# Patient Record
Sex: Female | Born: 1990 | Race: White | Hispanic: Yes | Marital: Married | Smoking: Never smoker
Health system: Southern US, Community
[De-identification: ages and names within clinical notes are randomized; demographics above are authoritative.]

## PROBLEM LIST (undated history)

## (undated) DIAGNOSIS — O24419 Gestational diabetes mellitus in pregnancy, unspecified control: Secondary | ICD-10-CM

## (undated) DIAGNOSIS — I10 Essential (primary) hypertension: Secondary | ICD-10-CM

## (undated) DIAGNOSIS — Z789 Other specified health status: Secondary | ICD-10-CM

## (undated) DIAGNOSIS — E119 Type 2 diabetes mellitus without complications: Secondary | ICD-10-CM

## (undated) HISTORY — DX: Type 2 diabetes mellitus without complications: E11.9

## (undated) HISTORY — DX: Essential (primary) hypertension: I10

## (undated) HISTORY — DX: Other specified health status: Z78.9

## (undated) HISTORY — PX: NO PAST SURGERIES: SHX2092

## (undated) HISTORY — DX: Gestational diabetes mellitus in pregnancy, unspecified control: O24.419

---

## 2020-03-28 ENCOUNTER — Ambulatory Visit (INDEPENDENT_AMBULATORY_CARE_PROVIDER_SITE_OTHER): Payer: 59 | Admitting: *Deleted

## 2020-03-28 ENCOUNTER — Other Ambulatory Visit: Payer: Self-pay

## 2020-03-28 VITALS — BP 132/75 | HR 80 | Temp 98.0°F | Ht 62.0 in | Wt 181.4 lb

## 2020-03-28 DIAGNOSIS — Z3201 Encounter for pregnancy test, result positive: Secondary | ICD-10-CM | POA: Diagnosis not present

## 2020-03-28 DIAGNOSIS — Z3480 Encounter for supervision of other normal pregnancy, unspecified trimester: Secondary | ICD-10-CM

## 2020-03-28 DIAGNOSIS — Z3A10 10 weeks gestation of pregnancy: Secondary | ICD-10-CM

## 2020-03-28 DIAGNOSIS — Z3481 Encounter for supervision of other normal pregnancy, first trimester: Secondary | ICD-10-CM

## 2020-03-28 LAB — POCT URINE PREGNANCY: Preg Test, Ur: POSITIVE — AB

## 2020-03-28 NOTE — Patient Instructions (Addendum)
https://www.cdc.gov/pregnancy/infections.html">  First Trimester of Pregnancy  The first trimester of pregnancy starts on the first day of your last menstrual period until the end of week 12. This is also called months 1 through 3 of pregnancy. Body changes during your first trimester Your body goes through many changes during pregnancy. The changes usually return to normal after your baby is born. Physical changes  You may gain or lose weight.  Your breasts may grow larger and hurt. The area around your nipples may get darker.  Dark spots or blotches may develop on your face.  You may have changes in your hair. Health changes  You may feel like you might vomit (nauseous), and you may vomit.  You may have heartburn.  You may have headaches.  You may have trouble pooping (constipation).  Your gums may bleed. Other changes  You may get tired easily.  You may pee (urinate) more often.  Your menstrual periods will stop.  You may not feel hungry.  You may want to eat certain kinds of food.  You may have changes in your emotions from day to day.  You may have more dreams. Follow these instructions at home: Medicines  Take over-the-counter and prescription medicines only as told by your doctor. Some medicines are not safe during pregnancy.  Take a prenatal vitamin that contains at least 600 micrograms (mcg) of folic acid. Eating and drinking  Eat healthy meals that include: ? Fresh fruits and vegetables. ? Whole grains. ? Good sources of protein, such as meat, eggs, or tofu. ? Low-fat dairy products.  Avoid raw meat and unpasteurized juice, milk, and cheese.  If you feel like you may vomit, or you vomit: ? Eat 4 or 5 small meals a day instead of 3 large meals. ? Try eating a few soda crackers. ? Drink liquids between meals instead of during meals.  You may need to take these actions to prevent or treat trouble pooping: ? Drink enough fluids to keep your pee  (urine) pale yellow. ? Eat foods that are high in fiber. These include beans, whole grains, and fresh fruits and vegetables. ? Limit foods that are high in fat and sugar. These include fried or sweet foods. Activity  Exercise only as told by your doctor. Most people can do their usual exercise routine during pregnancy.  Stop exercising if you have cramps or pain in your lower belly (abdomen) or low back.  Do not exercise if it is too hot or too humid, or if you are in a place of great height (high altitude).  Avoid heavy lifting.  If you choose to, you may have sex unless your doctor tells you not to. Relieving pain and discomfort  Wear a good support bra if your breasts are sore.  Rest with your legs raised (elevated) if you have leg cramps or low back pain.  If you have bulging veins (varicose veins) in your legs: ? Wear support hose as told by your doctor. ? Raise your feet for 15 minutes, 3-4 times a day. ? Limit salt in your food. Safety  Wear your seat belt at all times when you are in a car.  Talk with your doctor if someone is hurting you or yelling at you.  Talk with your doctor if you are feeling sad or have thoughts of hurting yourself. Lifestyle  Do not use hot tubs, steam rooms, or saunas.  Do not douche. Do not use tampons or scented sanitary pads.  Do not   Do not use herbal medicines, illegal drugs, or medicines that are not approved by your doctor. Do not drink alcohol.  Do not smoke or use any products that contain nicotine or tobacco. If you need help quitting, ask your doctor.  Avoid cat litter boxes and soil that is used by cats. These carry germs that can cause harm to the baby and can cause a loss of your baby by miscarriage or stillbirth. General instructions  Keep all follow-up visits. This is important.  Ask for help if you need counseling or if you need help with nutrition. Your doctor can give you advice or tell you where to go for help.  Visit  your dentist. At home, brush your teeth with a soft toothbrush. Floss gently.  Write down your questions. Take them to your prenatal visits. Where to find more information  American Pregnancy Association: americanpregnancy.org  [employer]: www.acog.org  Office on Women's Health: [employer] Contact a doctor if:  You are dizzy.  You have a fever.  You have mild cramps or pressure in your lower belly.  You have a nagging pain in your belly area.  You continue to feel like you may vomit, you vomit, or you have watery poop (diarrhea) for 24 hours or longer.  You have a bad-smelling fluid coming from your vagina.  You have pain when you pee.  You are exposed to a disease that spreads from person to person, such as chickenpox, measles, Zika virus, HIV, or hepatitis. Get help right away if:  You have spotting or bleeding from your vagina.  You have very bad belly cramping or pain.  You have shortness of breath or chest pain.  You have any kind of injury, such as from a fall or a car crash.  You have new or increased pain, swelling, or redness in an arm or leg. Summary  The first trimester of pregnancy starts on the first day of your last menstrual period until the end of week 12 (months 1 through 3).  Eat 4 or 5 small meals a day instead of 3 large meals.  Do not smoke or use any products that contain nicotine or tobacco. If you need help quitting, ask your doctor.  Keep all follow-up visits. This information is not intended to replace advice given to you by your health care provider. Make sure you discuss any questions you have with your health care provider. Document Revised: 06/03/2019 Document Reviewed: 04/09/2019 Elsevier Patient Education  2021 Elsevier Inc.  Warning Signs During Pregnancy During pregnancy, your body goes through many changes. Some changes may be uncomfortable, but most do not represent a  serious problem. However, it is important to learn when certain signs and symptoms may indicate a problem. Talk with your health care provider about your current health and any medical conditions you have. Make sure you know the symptoms to watch for and report. How does this affect me? Warning signs during pregnancy Let your health care provider know if you have any of the following warning signs:  Dizziness or feeling faint.  Nausea, vomiting, or diarrhea that lasts 24 hours or longer.  Spotting or bleeding from your vagina.  Abdominal cramping or pain in your pelvis or lower back.  Shortness of breath, difficulty breathing, or chest pain.  New or increased pain, swelling, or redness in an arm or leg.  Your baby is moving less than usual or is not moving. You should also watch for signs of  a serious medical condition called preeclampsia. This may include:  A severe, throbbing headache that does not go away.  Vision changes, such as blurred or double vision, light sensitivity, or seeing spots in front of your eyes.  Sudden or extreme swelling of your face, hands, legs, or feet. Pregnancy causes changes that may make it more likely for you to get an infection. Let your health care provider know if you have signs of infection, such as:  A fever.  A bad-smelling vaginal discharge.  Pain or burning when you urinate. How does this affect my baby? Throughout your pregnancy, always report any of the warning signs of a problem to your health care provider. This can help prevent complications that may affect your baby, including:  Increased risk for premature birth.  Infection that may be transmitted to your baby.  Increased risk for stillbirth. Follow these instructions at home:  Take over-the-counter and prescription medicines only as told by your health care provider.  Keep all follow-up visits. This is important.   Where to find more information  Office on Women's Health:  [employer]  [employer]: [employer] Contact a health care provider if:  You have any warning signs of problems during your pregnancy.  Any of the following apply to you during your pregnancy: ? You have strong emotions, such as sadness or anxiety, that interfere with work or personal relationships. ? You feel unsafe in your home. ? You are using tobacco products, alcohol, or drugs, and you need help to stop. Get help right away if:  You have signs or symptoms of labor before 37 weeks of pregnancy. These include: ? Contractions that are 5 minutes or less apart, or that increase in frequency, intensity, or length. ? Sudden, sharp abdominal pain or low back pain. ? Uncontrolled gush or trickle of fluid from your vagina. Summary  Always report any warning signs to your health care provider to prevent complications that may affect both you and your baby.  Talk with your health care provider about your current health and any medical conditions you have. Make sure you know the symptoms to watch for and report.  Keep all follow-up visits. This is important. This information is not intended to replace advice given to you by your health care provider. Make sure you discuss any questions you have with your health care provider. Document Revised: 06/03/2019 Document Reviewed: 05/01/2019 Elsevier Patient Education  2021 Elsevier Inc.  Genetic Testing During Pregnancy Why is genetic testing done? Genetic testing during pregnancy is also called prenatal genetic testing. This type of testing can determine if your baby is at risk of being born with a disorder caused by abnormal genes or chromosomes (genetic disorder). Chromosomes contain genes that control how your baby will develop in your womb. There are many different genetic disorders. Examples of genetic disorders that may be found through genetic testing include Down  syndrome and cystic fibrosis. Gene changes (mutations) can be passed down through families. Genetic testing is offered to women during pregnancy. You can choose whether to have genetic testing. Having genetic testing allows you to:  Discuss your test results and options with your health care provider.  Prepare for a baby that may be born with a genetic disorder. Learning about the disorder ahead of time helps you be better prepared to manage it. Your health care providers can also be prepared in case your baby requires special care before or after birth.  Consider whether you want  to continue with the pregnancy. In some cases, genetic testing may be done to learn about the traits a child will inherit. Types of genetic tests There are two basic types of genetic testing. Screening tests indicate whether your developing baby (fetus) is at higher risk for a genetic disorder. Diagnostic tests check actual fetal cells to diagnose a genetic disorder. Screening tests Screening tests will not harm your baby. They are recommended for all pregnant women. Types of screening tests include:  Carrier screening. This test involves checking genes from both parents by testing their blood or saliva. The test checks to find out if the parents carry a genetic mutation that may be passed to a baby. In most cases, both parents must carry the mutation for a baby to be at risk.  First trimester screening. This test combines a blood test with sound wave imaging of your baby (fetal ultrasound). This screening test checks for a risk of Down syndrome or other defects caused by having extra chromosomes. The ultrasound also checks for defects of the heart, abdomen, or skeleton.  Second trimester screening also combines a blood test with a fetal ultrasound exam. This test checks for a risk of Down syndrome or other defects caused by having extra chromosomes. The ultrasound allows your health care provider to look for genetic  defects of the face, brain, spine, heart, or limbs. Some women may choose to only have an ultrasound exam without a blood test.  Combined or sequential screening. This type of testing combines the results of first and second trimester screening. This type of testing may be more accurate than first or second trimester screening alone.  Cell-free DNA testing. This is a blood test that detects cells released by the placenta that get into the mother's blood. It can be used to check for a risk of Down syndrome, other extra chromosome syndromes, and disorders caused by abnormal numbers of sex chromosomes. This test can be done any time after 10 weeks of pregnancy.      Diagnostic tests Diagnostic tests carry slight risks of problems, including bleeding, infection, and loss of the pregnancy. These tests are done only if your baby is at risk for a genetic disorder. Your health care provider will discuss the risks and benefits of having diagnostic tests before performing these types of tests. Examples of diagnostic tests include:  Chorionic villus sampling (CVS). This involves a procedure to remove and test a sample of cells taken from the placenta. The procedure may be done between 10 and 12 weeks of pregnancy.  Amniocentesis. This involves a procedure to remove and test a sample of fluid (amniotic fluid) and cells from the sac that surrounds the developing baby. The procedure may be done any time during the pregnancy, but it is usually done between 15 and 20 weeks of pregnancy. What do the results mean? For a screening test:  If the results are negative, it often means that your child is not at higher risk. There is still a slight chance your child could have a genetic disorder.  If the results are positive, it does not mean your child will have a genetic disorder. It may mean that your child has a higher-than-normal risk for a genetic disorder. In that case, you should talk with your health care provider  about whether you should have diagnostic genetic tests. For a diagnostic test:  If the result is negative, it is unlikely that your child will have a genetic disorder.  If the test  is positive for a genetic disorder, it is likely that your child will have the disorder. The test may not tell how severe the disorder will be. Talk with your health care provider about your options. Talk with your health care provider about what your results mean. Questions to ask your health care provider Before talking to your health care provider about genetic testing, find out if there is a history of genetic disorders in your family. It may also help to know your family's ethnic origins. Then ask your health care provider the following questions:  Is my baby at risk for a genetic disorder?  What are the benefits of having genetic screening?  What tests are best for me and my baby?  What are the risks of each test?  If I get a positive result on a screening test, what is the next step?  Should I meet with a genetic counselor?  Should my partner or other members of my family be tested?  How much do the tests cost? Will my insurance cover the testing? Summary  Genetic testing is done during pregnancy to find out whether your child is at risk for a genetic disorder.  Genetic testing is offered to women during pregnancy. You can choose whether to have genetic testing.  There are two basic types of genetic testing. Screening tests indicate whether your developing baby (fetus) is at higher risk for a genetic disorder. Diagnostic tests check actual fetal cells to diagnose a genetic disorder.  If a diagnostic genetic test is positive, talk with your health care provider about your options. This information is not intended to replace advice given to you by your health care provider. Make sure you discuss any questions you have with your health care provider. Document Revised: 07/17/2019 Document Reviewed:  07/17/2019 Elsevier Patient Education  2021 Elsevier Inc.  Safe Medications in Pregnancy   Acne: Benzoyl Peroxide Salicylic Acid  Backache/Headache: Tylenol: 2 regular strength every 4 hours OR              2 Extra strength every 6 hours  Colds/Coughs/Allergies: Benadryl (alcohol free) 25 mg every 6 hours as needed Breath right strips Claritin Cepacol throat lozenges Chloraseptic throat spray Cold-Eeze- up to three times per day Cough drops, alcohol free Flonase (by prescription only) Guaifenesin Mucinex Robitussin DM (plain only, alcohol free) Saline nasal spray/drops Sudafed (pseudoephedrine) & Actifed ** use only after [redacted] weeks gestation and if you do not have high blood pressure Tylenol Vicks Vaporub Zinc lozenges Zyrtec   Constipation: Colace Ducolax suppositories Fleet enema Glycerin suppositories Metamucil Milk of magnesia Miralax Senokot Smooth move tea  Diarrhea: Kaopectate Imodium A-D  *NO pepto Bismol  Hemorrhoids: Anusol Anusol HC Preparation H Tucks  Indigestion: Tums Maalox Mylanta Zantac  Pepcid  Insomnia: Benadryl (alcohol free) 25mg  every 6 hours as needed Tylenol PM Unisom, no Gelcaps  Leg Cramps: Tums MagGel  Nausea/Vomiting:  Bonine Dramamine Emetrol Ginger extract Sea bands Meclizine  Nausea medication to take during pregnancy:  Unisom (doxylamine succinate 25 mg tablets) Take one tablet daily at bedtime. If symptoms are not adequately controlled, the dose can be increased to a maximum recommended dose of two tablets daily (1/2 tablet in the morning, 1/2 tablet mid-afternoon and one at bedtime). Vitamin B6 100mg  tablets. Take one tablet twice a day (up to 200 mg per day).  Skin Rashes: Aveeno products Benadryl cream or 25mg  every 6 hours as needed Calamine Lotion 1% cortisone cream  Yeast infection: Gyne-lotrimin 7 Monistat  7   **If taking multiple medications, please check labels to avoid duplicating  the same active ingredients **take medication as directed on the label ** Do not exceed 4000 mg of tylenol in 24 hours **Do not take medications that contain aspirin or ibuprofen

## 2020-03-28 NOTE — Progress Notes (Signed)
   Location: [facility] Patient: clinic Provider: clinic  PRENATAL INTAKE SUMMARY  Ms. Jensen presents today New OB Nurse Interview.  OB History    Gravida  3   Para      Term      Preterm      AB  2   Living        SAB  2   IAB      Ectopic      Multiple      Live Births             I have reviewed the patient's medical, obstetrical, social, and family histories, medications, and available lab results.  SUBJECTIVE She has no unusual complaints  OBJECTIVE Initial Nurse interview for history/labs (New OB)  EDD: 10/23/20 by LMP GA: [redacted]w[redacted]d G3P0020 FHT: not assessed  GENERAL APPEARANCE: alert, well appearing, in no apparent distress, oriented to person, place and time   ASSESSMENT Positive urine pregnancy test Normal pregnancy  PLAN Prenatal care:  [facility] Labs to be completed at next visit with Raelyn Mora, CNM 04/07/20  Follow Up Instructions:   I discussed the assessment and treatment plan with the patient. The patient was provided an opportunity to ask questions and all were answered. The patient agreed with the plan and demonstrated an understanding of the instructions.   The patient was advised to call back or seek an in-person evaluation if the symptoms worsen or if the condition fails to improve as anticipated.  I provided 30 minutes of  face-to-face time during this encounter.  Clovis Pu, RN

## 2020-04-07 ENCOUNTER — Encounter: Payer: Self-pay | Admitting: Obstetrics and Gynecology

## 2020-04-07 ENCOUNTER — Ambulatory Visit (INDEPENDENT_AMBULATORY_CARE_PROVIDER_SITE_OTHER): Payer: 59 | Admitting: Obstetrics and Gynecology

## 2020-04-07 ENCOUNTER — Other Ambulatory Visit (HOSPITAL_COMMUNITY)
Admission: RE | Admit: 2020-04-07 | Discharge: 2020-04-07 | Disposition: A | Discharge status: Home / Self Care | Payer: 59 | Source: Ambulatory Visit | Attending: Obstetrics and Gynecology | Admitting: Obstetrics and Gynecology

## 2020-04-07 ENCOUNTER — Other Ambulatory Visit: Payer: Self-pay

## 2020-04-07 VITALS — BP 151/91 | HR 120 | Temp 99.0°F | Wt 179.2 lb

## 2020-04-07 DIAGNOSIS — O09299 Supervision of pregnancy with other poor reproductive or obstetric history, unspecified trimester: Secondary | ICD-10-CM

## 2020-04-07 DIAGNOSIS — Z3480 Encounter for supervision of other normal pregnancy, unspecified trimester: Secondary | ICD-10-CM | POA: Insufficient documentation

## 2020-04-07 DIAGNOSIS — Z3A11 11 weeks gestation of pregnancy: Secondary | ICD-10-CM

## 2020-04-07 DIAGNOSIS — O169 Unspecified maternal hypertension, unspecified trimester: Secondary | ICD-10-CM | POA: Insufficient documentation

## 2020-04-07 DIAGNOSIS — O99210 Obesity complicating pregnancy, unspecified trimester: Secondary | ICD-10-CM

## 2020-04-07 DIAGNOSIS — O0990 Supervision of high risk pregnancy, unspecified, unspecified trimester: Secondary | ICD-10-CM | POA: Insufficient documentation

## 2020-04-07 HISTORY — DX: Supervision of pregnancy with other poor reproductive or obstetric history, unspecified trimester: O09.299

## 2020-04-07 HISTORY — DX: Obesity complicating pregnancy, unspecified trimester: O99.210

## 2020-04-07 MED ORDER — ASPIRIN 81 MG PO CHEW: 81.0000 mg | CHEWABLE_TABLET | Freq: Every day | ORAL | 6 refills | Status: DC

## 2020-04-07 NOTE — Progress Notes (Signed)
INITIAL OBSTETRICAL VISIT Patient name: Lisa Ritter MRN [id number]  Date of birth: 1990/10/09 Chief Complaint:   Initial Prenatal Visit  History of Present Illness:   Lisa Ritter is a 30 y.o. G62P0020 Caucasian female at 65w4dby LMP with an Estimated Date of Delivery: 10/23/20 being seen today for her initial obstetrical visit.  Her obstetrical history is significant for obesity and history of miscarriage x 2. SAB @ 6 wks with no complications in 23790and 2018. This is a planned pregnancy. She and the father of the baby (FOB) "SAnnie Main live together. She has a support system that consists of the FOB/family/friends. Today she reports feeling a little anxious because she has lst 2 babies in the past. She is worried that this pregnancy has gone the farthest of all of her pregnancies.   Patient's last menstrual period was 01/17/2020 (exact date). Last pap 11/2019. Results were: normal per pt Review of Systems:   Pertinent items are noted in HPI Denies cramping/contractions, leakage of fluid, vaginal bleeding, abnormal vaginal discharge w/ itching/odor/irritation, headaches, visual changes, shortness of breath, chest pain, abdominal pain, severe nausea/vomiting, or problems with urination or bowel movements unless otherwise stated above.  Pertinent History Reviewed:  Reviewed past medical,surgical, social, obstetrical and family history.  Reviewed problem list, medications and allergies. OB History  Gravida Para Term Preterm AB Living  3       2    SAB IAB Ectopic Multiple Live Births  2            # Outcome Date GA Lbr Len/2nd Weight Sex Delivery Anes PTL Lv  3 Current           2 SAB 2018          1 SAB 2015           Physical Assessment:   Vitals:   04/07/20 1345  BP: (!) 151/91  Pulse: (!) 120  Temp: 99 F (37.2 C)  Weight: 179 lb 3.2 oz (81.3 kg)  Body mass index is 32.78 kg/m.       Physical Examination:  General appearance - well appearing, and in no distress  Mental  status - alert, oriented to person, place, and time  Psych:  She has a normal mood and affect  Skin - warm and dry, normal color, no suspicious lesions noted  Chest - effort normal, all lung fields clear to auscultation bilaterally  Heart - normal rate and regular rhythm  Abdomen - soft, nontender  Extremities:  No swelling or varicosities noted  Pelvic - VULVA: normal appearing vulva with no masses, tenderness or lesions  VAGINA: normal appearing vagina with normal color and discharge, no lesions.    CERVIX: normal appearing cervix without discharge or lesions, no CMT  Thin prep pap is not done  Patient informed that the ultrasound is considered a limited OB ultrasound and is not intended to be a complete ultrasound exam.  Patient also informed that the ultrasound is not being completed with the intent of assessing for fetal or placental anomalies or any pelvic abnormalities.  Explained that the purpose of today's ultrasound is to assess for viability.  FHTs visualized by U/S: 155 bpm. Patient acknowledges the purpose of the exam and the limitations of the study.  Assessment & Plan:  1) Low-Risk Pregnancy G3P0020 at 115w4dith an Estimated Date of Delivery: 10/23/20   2) Initial OB visit - Welcomed to practice and introduced self to patient in addition to discussing other advanced  practice providers that she may be seeing at this practice - Congratulated patient - Anticipatory guidance on upcoming appointments - Educated on [place] and pregnancy and the integration of virtual appointments  - Educated on babyscripts app- patient reports she has not received email, encouraged to look in spam folder and to call office if she still has not received email - patient verbalizes understanding    3) Supervision of other normal pregnancy, antepartum - Cervicovaginal ancillary only( [place]) - CBC/D/Plt+RPR+Rh+ABO+Rub Ab... - Culture, OB Urine - Genetic Screening - Enroll Patient in PreNatal  Babyscripts - Protein / creatinine ratio, urine - Comp Met (CMET)  4) Elevated blood pressure affecting pregnancy, antepartum - Protein / creatinine ratio, urine - Comp Met (CMET)  5) [redacted] weeks gestation of pregnancy - Protein / creatinine ratio, urine - Comp Met (CMET)     Meds:  Meds ordered this encounter  Medications  . aspirin 81 MG chewable tablet    Sig: Chew 1 tablet (81 mg total) by mouth daily.    Dispense:  30 tablet    Refill:  6    Order Specific Question:   Supervising Provider    Answer:   Donnamae Jude [[id number]]    Initial labs obtained Continue prenatal vitamins Reviewed n/v relief measures and warning s/s to report Reviewed recommended weight gain based on pre-gravid BMI Encouraged well-balanced diet Genetic Screening discussed: ordered Cystic fibrosis, SMA, Fragile X screening discussed ordered The nature of [place] with multiple MDs and other Advanced Practice Providers was explained to patient; also emphasized that residents, students are part of our team.  Discussed optimized OB schedule and video visits. Advised can have an in-office visit whenever she feels she needs to be seen.  Does not have own BP cuff. BP cuff Rx faxed today. Explained to patient that BP will be mailed to her house. Check BP weekly, let us know if >140/90. Advised to call during normal business hours and there is an after-hours nurse line available.    Follow-up: Return in about 2 months (around 06/07/2020) for Return OB visit.   Orders Placed This Encounter  Procedures  . Culture, OB Urine  . [place] MFM OB DETAIL +14 WK  . CBC/D/Plt+RPR+Rh+ABO+Rub Ab...  . Genetic Screening  . Protein / creatinine ratio, urine  . Comp Met (CMET)  . HgB A1c    Laury Deep MSN, CNM 04/07/2020

## 2020-04-08 LAB — COMPREHENSIVE METABOLIC PANEL
ALT: 44 IU/L — ABNORMAL HIGH (ref 0–32)
AST: 25 IU/L (ref 0–40)
Albumin/Globulin Ratio: 1.7 (ref 1.2–2.2)
Albumin: 4.2 g/dL (ref 3.9–5.0)
Alkaline Phosphatase: 114 IU/L (ref 44–121)
BUN/Creatinine Ratio: 13 (ref 9–23)
BUN: 6 mg/dL (ref 6–20)
Bilirubin Total: 0.2 mg/dL (ref 0.0–1.2)
CO2: 17 mmol/L — ABNORMAL LOW (ref 20–29)
Calcium: 9.1 mg/dL (ref 8.7–10.2)
Chloride: 102 mmol/L (ref 96–106)
Creatinine, Ser: 0.46 mg/dL — ABNORMAL LOW (ref 0.57–1.00)
Globulin, Total: 2.5 g/dL (ref 1.5–4.5)
Glucose: 163 mg/dL — ABNORMAL HIGH (ref 65–99)
Potassium: 3.8 mmol/L (ref 3.5–5.2)
Sodium: 136 mmol/L (ref 134–144)
Total Protein: 6.7 g/dL (ref 6.0–8.5)
eGFR: 132 mL/min/{1.73_m2} (ref >59)

## 2020-04-08 LAB — CBC/D/PLT+RPR+RH+ABO+RUB AB...
Antibody Screen: NEGATIVE
Basophils Absolute: 0 10*3/uL (ref 0.0–0.2)
Basos: 0 %
EOS (ABSOLUTE): 0.1 10*3/uL (ref 0.0–0.4)
Eos: 1 %
HCV Ab: <0.1 s/co ratio (ref 0.0–0.9)
HIV Screen 4th Generation wRfx: NONREACTIVE
Hematocrit: 34.5 % (ref 34.0–46.6)
Hemoglobin: 12.1 g/dL (ref 11.1–15.9)
Hepatitis B Surface Ag: NEGATIVE
Immature Grans (Abs): 0 10*3/uL (ref 0.0–0.1)
Immature Granulocytes: 0 %
Lymphocytes Absolute: 2.7 10*3/uL (ref 0.7–3.1)
Lymphs: 23 %
MCH: 30.3 pg (ref 26.6–33.0)
MCHC: 35.1 g/dL (ref 31.5–35.7)
MCV: 86 fL (ref 79–97)
Monocytes Absolute: 0.5 10*3/uL (ref 0.1–0.9)
Monocytes: 4 %
Neutrophils Absolute: 8.2 10*3/uL — ABNORMAL HIGH (ref 1.4–7.0)
Neutrophils: 72 %
Platelets: 419 10*3/uL (ref 150–450)
RBC: 4 x10E6/uL (ref 3.77–5.28)
RDW: 12.7 % (ref 11.7–15.4)
RPR Ser Ql: NONREACTIVE
Rh Factor: POSITIVE
Rubella Antibodies, IGG: 2.41 index (ref >0.99)
WBC: 11.5 10*3/uL — ABNORMAL HIGH (ref 3.4–10.8)

## 2020-04-08 LAB — PROTEIN / CREATININE RATIO, URINE
Creatinine, Urine: 25.6 mg/dL
Protein, Ur: <4 mg/dL

## 2020-04-08 LAB — CERVICOVAGINAL ANCILLARY ONLY
Bacterial Vaginitis (gardnerella): POSITIVE — AB
Candida Glabrata: NEGATIVE
Candida Vaginitis: NEGATIVE
Chlamydia: NEGATIVE
Comment: NEGATIVE
Comment: NEGATIVE
Comment: NEGATIVE
Comment: NEGATIVE
Comment: NEGATIVE
Comment: NORMAL
Neisseria Gonorrhea: NEGATIVE
Trichomonas: NEGATIVE

## 2020-04-08 LAB — HEMOGLOBIN A1C
Est. average glucose Bld gHb Est-mCnc: 103 mg/dL
Hgb A1c MFr Bld: 5.2 % (ref 4.8–5.6)

## 2020-04-08 LAB — HCV INTERPRETATION

## 2020-04-09 LAB — URINE CULTURE, OB REFLEX: Organism ID, Bacteria: NO GROWTH

## 2020-04-09 LAB — CULTURE, OB URINE

## 2020-04-11 ENCOUNTER — Other Ambulatory Visit: Payer: Self-pay | Admitting: Obstetrics and Gynecology

## 2020-04-11 DIAGNOSIS — N760 Acute vaginitis: Secondary | ICD-10-CM

## 2020-04-11 MED ORDER — METRONIDAZOLE 500 MG PO TABS: 500.0000 mg | ORAL_TABLET | Freq: Two times a day (BID) | ORAL | 0 refills | Status: DC

## 2020-04-12 ENCOUNTER — Telehealth: Payer: Self-pay | Admitting: *Deleted

## 2020-04-12 NOTE — Telephone Encounter (Signed)
Patient called regarding lab result. Patient verified DOB. Advised of positive BV and need for medication. Metronidazole 500 mg 1 tab PO BID x 7 days sent to pharmacy on file. If patient develop yeast infection to call clinic for treatment.  Clovis Pu, RN

## 2020-04-12 NOTE — Telephone Encounter (Signed)
-----   Message from Raelyn Mora, [place] sent at 04/11/2020  4:48 AM EDT ----- Please notify of BV and Rx waiting in pharmacy

## 2020-04-13 ENCOUNTER — Ambulatory Visit (INDEPENDENT_AMBULATORY_CARE_PROVIDER_SITE_OTHER): Payer: 59 | Admitting: Licensed Clinical Social Worker

## 2020-04-13 DIAGNOSIS — F4321 Adjustment disorder with depressed mood: Secondary | ICD-10-CM

## 2020-04-13 NOTE — BH Specialist Note (Signed)
Integrated Behavioral Health via Telemedicine Visit  04/13/2020 Lisa Ritter [id number]  Number of Integrated Behavioral Health visits: 1/6 Session Start time: 10:00am  Session End time: 10:26am Total time: 26 mins via mychart video   Referring Provider: Carloyn Jaeger  Patient/Family location: Home  [facility] Provider location: [facility] All persons participating in visit: Pt M. Dayrit and LCSWA A. Jasiri Hanawalt  Types of Service: Individual psychotherapy and Video visit  I connected with [name] and/or Zhavia Hoglund's n/a via  Telephone or Video Enabled Telemedicine Application  (Video is Caregility application) and verified that I am speaking with the correct person using two identifiers. Discussed confidentiality: Yes   I discussed the limitations of telemedicine and the availability of in person appointments.  Discussed there is a possibility of technology failure and discussed alternative modes of communication if that failure occurs.  I discussed that engaging in this telemedicine visit, they consent to the provision of behavioral healthcare and the services will be billed under their insurance.  Patient and/or legal guardian expressed understanding and consented to Telemedicine visit: Yes   Presenting Concerns: Patient and/or family reports the following symptoms/concerns: trouble staying asleep, decrease appetite and energy Duration of problem: approx 2 months; Severity of problem: mild  Patient and/or Family's Strengths/Protective Factors: Concrete supports in place (healthy food, safe environments, etc.)  Goals Addressed: Patient will: 1.  Reduce symptoms of: adjustment disorder  2.  Increase knowledge and/or ability of: healthy habits  3.  Demonstrate ability to: Increase healthy adjustment to current life circumstances  Progress towards Goals: Ongoing  Interventions: Interventions utilized:  Supportive Counseling Standardized Assessments completed: PHQ  9   Assessment: Patient currently experiencing adjustment disorder  Patient may benefit from integrated behavioral health   Plan: 1. Follow up with behavioral health clinician on : 3 weeks via mychart  2. Behavioral recommendations: delegate task to prevent burnout, place warm compress over eyes to assist with relaxing sleep and communicate needs  3. Referral(s): Integrated [employer] (In Clinic)  I discussed the assessment and treatment plan with the patient and/or parent/guardian. They were provided an opportunity to ask questions and all were answered. They agreed with the plan and demonstrated an understanding of the instructions.   They were advised to call back or seek an in-person evaluation if the symptoms worsen or if the condition fails to improve as anticipated.  Gwyndolyn Saxon, LCSW

## 2020-05-16 ENCOUNTER — Other Ambulatory Visit: Payer: Self-pay | Admitting: *Deleted

## 2020-05-23 ENCOUNTER — Other Ambulatory Visit: Payer: Self-pay

## 2020-05-23 ENCOUNTER — Ambulatory Visit: Payer: 59 | Attending: Obstetrics and Gynecology

## 2020-05-23 ENCOUNTER — Other Ambulatory Visit: Payer: Self-pay | Admitting: Obstetrics and Gynecology

## 2020-05-23 ENCOUNTER — Encounter: Payer: Self-pay | Admitting: *Deleted

## 2020-05-23 ENCOUNTER — Ambulatory Visit: Payer: 59 | Admitting: *Deleted

## 2020-05-23 DIAGNOSIS — O99210 Obesity complicating pregnancy, unspecified trimester: Secondary | ICD-10-CM | POA: Diagnosis present

## 2020-05-23 DIAGNOSIS — Z3480 Encounter for supervision of other normal pregnancy, unspecified trimester: Secondary | ICD-10-CM

## 2020-05-24 ENCOUNTER — Other Ambulatory Visit: Payer: Self-pay | Admitting: *Deleted

## 2020-05-24 DIAGNOSIS — Z363 Encounter for antenatal screening for malformations: Secondary | ICD-10-CM

## 2020-05-27 ENCOUNTER — Ambulatory Visit: Payer: 59

## 2020-05-27 ENCOUNTER — Other Ambulatory Visit: Payer: 59

## 2020-06-02 ENCOUNTER — Encounter: Payer: Self-pay | Admitting: Obstetrics and Gynecology

## 2020-06-02 ENCOUNTER — Other Ambulatory Visit: Payer: Self-pay

## 2020-06-02 ENCOUNTER — Ambulatory Visit (INDEPENDENT_AMBULATORY_CARE_PROVIDER_SITE_OTHER): Payer: 59 | Admitting: Obstetrics and Gynecology

## 2020-06-02 VITALS — BP 137/81 | HR 98 | Temp 98.0°F | Wt 179.0 lb

## 2020-06-02 DIAGNOSIS — O162 Unspecified maternal hypertension, second trimester: Secondary | ICD-10-CM

## 2020-06-02 DIAGNOSIS — O10919 Unspecified pre-existing hypertension complicating pregnancy, unspecified trimester: Secondary | ICD-10-CM

## 2020-06-02 DIAGNOSIS — Z3480 Encounter for supervision of other normal pregnancy, unspecified trimester: Secondary | ICD-10-CM

## 2020-06-02 DIAGNOSIS — Z3A17 17 weeks gestation of pregnancy: Secondary | ICD-10-CM

## 2020-06-02 NOTE — Progress Notes (Signed)
  [place] PREGNANCY OFFICE VISIT Patient name: Lisa Ritter MRN [id number]  Date of birth: 1990-02-07 Chief Complaint:   Routine Prenatal Visit  History of Present Illness:   Lisa Ritter is a 30 y.o. G35P0020 female at [redacted]w[redacted]d with an Estimated Date of Delivery: 11/09/20 being seen today for ongoing management of a high-risk pregnancy complicated by cHTN (newly diagnosed) Today she reports no complaints. Contractions: Not present. Vag. Bleeding: None.  Movement: Absent. denies leaking of fluid.  Review of Systems:   Pertinent items are noted in HPI Denies abnormal vaginal discharge w/ itching/odor/irritation, headaches, visual changes, shortness of breath, chest pain, abdominal pain, severe nausea/vomiting, or problems with urination or bowel movements unless otherwise stated above. Pertinent History Reviewed:  Reviewed past medical,surgical, social, obstetrical and family history.  Reviewed problem list, medications and allergies. Physical Assessment:   Vitals:   06/02/20 1319  BP: 137/81  Pulse: 98  Temp: 98 F (36.7 C)  Weight: 179 lb (81.2 kg)  Body mass index is 32.74 kg/m.           Physical Examination:   General appearance: alert, well appearing, and in no distress and oriented to person, place, and time  Mental status: alert, oriented to person, place, and time, normal mood, behavior, speech, dress, motor activity, and thought processes  Skin: warm & dry   Extremities: Edema: None    Cardiovascular: normal heart rate noted  Respiratory: normal respiratory effort, no distress  Abdomen: gravid, soft, non-tender  Pelvic: Cervical exam deferred         Fetal Status: Fetal Heart Rate (bpm): 139 Fundal Height: 18 cm Movement: Absent    Fetal Surveillance Testing today: none   No results found for this or any previous visit (from the past 24 hour(s)).  Assessment & Plan:  1) High-risk pregnancy G3P0020 at [redacted]w[redacted]d with an Estimated Date of Delivery: 11/09/20   2) Supervision of  other normal pregnancy, antepartum  - AFP, Serum, Open Spina Bifida  3) [redacted] weeks gestation of pregnancy   4) Elevated blood pressure affecting pregnancy in second trimester, antepartum  - CBC,  - Comp Met (CMET),  - Protein / creatinine ratio, urine  5) Chronic hypertension affecting pregnancy - Patient states her BP last night at home was 117/83  Meds: No orders of the defined types were placed in this encounter.   Labs/procedures today: Repeat PEC labs and AFP Treatment Plan:  Manage as cHTN on no meds in pregnancy  Reviewed: Preterm labor symptoms and general obstetric precautions including but not limited to vaginal bleeding, contractions, leaking of fluid and fetal movement were reviewed in detail with the patient.  All questions were answered. Has home bp cuff. Check bp weekly, let us know if >140/90.   Follow-up: Return in about 7 weeks (around 07/21/2020) for Return OB - My Chart video.  Orders Placed This Encounter  Procedures  . CBC  . Comp Met (CMET)  . Protein / creatinine ratio, urine  . AFP, Serum, Open Spina Bifida   Laury Deep MSN, CNM 06/02/2020 2:05 PM

## 2020-06-03 LAB — COMPREHENSIVE METABOLIC PANEL
ALT: 79 IU/L — ABNORMAL HIGH (ref 0–32)
AST: 35 IU/L (ref 0–40)
Albumin/Globulin Ratio: 1.5 (ref 1.2–2.2)
Albumin: 4 g/dL (ref 3.9–5.0)
Alkaline Phosphatase: 127 IU/L — ABNORMAL HIGH (ref 44–121)
BUN/Creatinine Ratio: 8 — ABNORMAL LOW (ref 9–23)
BUN: 4 mg/dL — ABNORMAL LOW (ref 6–20)
Bilirubin Total: 0.2 mg/dL (ref 0.0–1.2)
CO2: 22 mmol/L (ref 20–29)
Calcium: 9.4 mg/dL (ref 8.7–10.2)
Chloride: 100 mmol/L (ref 96–106)
Creatinine, Ser: 0.53 mg/dL — ABNORMAL LOW (ref 0.57–1.00)
Globulin, Total: 2.7 g/dL (ref 1.5–4.5)
Glucose: 85 mg/dL (ref 65–99)
Potassium: 4.1 mmol/L (ref 3.5–5.2)
Sodium: 134 mmol/L (ref 134–144)
Total Protein: 6.7 g/dL (ref 6.0–8.5)
eGFR: 128 mL/min/{1.73_m2} (ref >59)

## 2020-06-03 LAB — CBC
Hematocrit: 35.3 % (ref 34.0–46.6)
Hemoglobin: 11.8 g/dL (ref 11.1–15.9)
MCH: 30 pg (ref 26.6–33.0)
MCHC: 33.4 g/dL (ref 31.5–35.7)
MCV: 90 fL (ref 79–97)
Platelets: 393 10*3/uL (ref 150–450)
RBC: 3.93 x10E6/uL (ref 3.77–5.28)
RDW: 12.4 % (ref 11.7–15.4)
WBC: 12.6 10*3/uL — ABNORMAL HIGH (ref 3.4–10.8)

## 2020-06-03 LAB — PROTEIN / CREATININE RATIO, URINE
Creatinine, Urine: 33.2 mg/dL
Protein, Ur: 5.6 mg/dL
Protein/Creat Ratio: 169 mg/g creat (ref 0–200)

## 2020-06-04 LAB — AFP, SERUM, OPEN SPINA BIFIDA
AFP MoM: 2.23
AFP Value: 79.5 ng/mL
Gest. Age on Collection Date: 17 weeks
Maternal Age At EDD: 31.3 yr
OSBR Risk 1 IN: 492
Test Results:: NEGATIVE
Weight: 179 [lb_av]

## 2020-06-20 ENCOUNTER — Ambulatory Visit: Payer: 59 | Admitting: *Deleted

## 2020-06-20 ENCOUNTER — Other Ambulatory Visit: Payer: Self-pay | Admitting: *Deleted

## 2020-06-20 ENCOUNTER — Ambulatory Visit: Payer: 59 | Attending: Obstetrics and Gynecology

## 2020-06-20 ENCOUNTER — Other Ambulatory Visit: Payer: Self-pay

## 2020-06-20 ENCOUNTER — Encounter: Payer: Self-pay | Admitting: *Deleted

## 2020-06-20 VITALS — BP 135/70 | HR 87

## 2020-06-20 DIAGNOSIS — Z3480 Encounter for supervision of other normal pregnancy, unspecified trimester: Secondary | ICD-10-CM | POA: Diagnosis present

## 2020-06-20 DIAGNOSIS — Z363 Encounter for antenatal screening for malformations: Secondary | ICD-10-CM | POA: Diagnosis present

## 2020-06-23 ENCOUNTER — Telehealth: Payer: Self-pay

## 2020-06-23 ENCOUNTER — Other Ambulatory Visit: Payer: Self-pay

## 2020-06-23 ENCOUNTER — Telehealth (INDEPENDENT_AMBULATORY_CARE_PROVIDER_SITE_OTHER): Payer: 59 | Admitting: Obstetrics and Gynecology

## 2020-06-23 ENCOUNTER — Encounter: Payer: Self-pay | Admitting: Obstetrics and Gynecology

## 2020-06-23 VITALS — BP 142/89 | HR 80

## 2020-06-23 DIAGNOSIS — Z3A20 20 weeks gestation of pregnancy: Secondary | ICD-10-CM

## 2020-06-23 DIAGNOSIS — O10919 Unspecified pre-existing hypertension complicating pregnancy, unspecified trimester: Secondary | ICD-10-CM

## 2020-06-23 DIAGNOSIS — O0990 Supervision of high risk pregnancy, unspecified, unspecified trimester: Secondary | ICD-10-CM

## 2020-06-23 DIAGNOSIS — O10912 Unspecified pre-existing hypertension complicating pregnancy, second trimester: Secondary | ICD-10-CM

## 2020-06-23 NOTE — Progress Notes (Signed)
   MY CHART VIDEO VIRTUAL OBSTETRICS VISIT ENCOUNTER NOTE  I connected with Lisa Ritter on 06/23/20 at 11:10 AM EDT by My Chart video at home and verified that I am speaking with the correct person using two identifiers. Provider located at [facility] for [employer] at [place].   I discussed the limitations, risks, security and privacy concerns of performing an evaluation and management service by My Chart video and the availability of in person appointments. I also discussed with the patient that there may be a patient responsible charge related to this service. The patient expressed understanding and agreed to proceed.  I discussed the limitations of telemedicine and the availability of in person appointments.  Discussed there is a possibility of technology failure and discussed alternative modes of communication if that failure occurs.  Subjective:  Lisa Ritter is a 30 y.o. G3P0020 at [redacted]w[redacted]d being followed for ongoing prenatal care.  She is currently monitored for the following issues for this high-risk pregnancy and has Supervision of high risk pregnancy, antepartum; Obesity affecting pregnancy, antepartum; History of miscarriage, currently pregnant; Elevated blood pressure affecting pregnancy, antepartum; and Chronic hypertension affecting pregnancy on their problem list.  Patient reports no complaints. Reports fetal movement. Denies any contractions, bleeding or leaking of fluid.   The following portions of the patient's history were reviewed and updated as appropriate: allergies, current medications, past family history, past medical history, past social history, past surgical history and problem list.   Objective:   General:  Alert, oriented and cooperative.   Mental Status: Normal mood and affect perceived. Normal judgment and thought content.  Rest of physical exam deferred due to type of encounter  BP (!) 142/89   Pulse 80   LMP 01/17/2020 (Exact Date)  **Done by patient's  own at home BP cuff and scale  Assessment and Plan:  Pregnancy: G3P0020 at [redacted]w[redacted]d  1. Supervision of high risk pregnancy, antepartum  2. Chronic hypertension affecting pregnancy - Stable BPs despite elevation  3. [redacted] weeks gestation of pregnancy   Preterm labor symptoms and general obstetric precautions including but not limited to vaginal bleeding, contractions, leaking of fluid and fetal movement were reviewed in detail with the patient.  I discussed the assessment and treatment plan with the patient. The patient was provided an opportunity to ask questions and all were answered. The patient agreed with the plan and demonstrated an understanding of the instructions. The patient was advised to call back or seek an in-person office evaluation/go to [facility] at [facility] for any urgent or concerning symptoms. Please refer to After Visit Summary for other counseling recommendations.   I provided 5 minutes of non-face-to-face time during this encounter. There was 5 minutes of chart review time spent prior to this encounter. Total time spent = 10 minutes.  Return in about 4 weeks (around 07/21/2020) for Return OB - My Chart video.  Future Appointments  Date Time Provider Department Center  07/18/2020 11:15 AM [facility] NURSE [facility]  07/18/2020 11:30 AM [facility] US3 [facility]  07/22/2020 11:10 AM Bernerd Limbo, CNM [facility] None    Raelyn Mora, CNM Center for [employer] [facility], [facility]

## 2020-06-23 NOTE — Telephone Encounter (Signed)
Attempted to call pt to begin virtual visit. Pt did not answer. VM left asking pt to return call to office.  °

## 2020-06-23 NOTE — Patient Instructions (Signed)
Hypertension During Pregnancy High blood pressure (hypertension) is when the force of blood pumping through the arteries is high enough to cause problems with your health. Arteries are blood vessels that carry blood from the heart throughout the body. Hypertension during pregnancy can cause problems for you and your baby. It can be mild or severe. There are different types of hypertension that can happen during pregnancy. These include: Chronic hypertension. This happens when you had high blood pressure before you became pregnant, and it continues during the pregnancy. Hypertension that develops before you are [redacted] weeks pregnant and continues during the pregnancy is also called chronic hypertension. If you have chronic hypertension, it will not go away after you have your baby. You will need follow-up visits with your health care provider after you have your baby. Your health care provider may want you to keep taking medicine for your blood pressure. Gestational hypertension. This is hypertension that develops after the 20th week of pregnancy. Gestational hypertension usually goes away after you have your baby, but your health care provider will need to monitor your blood pressure to make sure that it is getting better. Postpartum hypertension. This is high blood pressure that was present before delivery and continues after delivery or that starts after delivery. This usually occurs within 48 hours after childbirth but may occur up to 6 weeks after giving birth. When hypertension during pregnancy is severe, it is a medical emergency that requires treatment right away. How does this affect me? Women who have hypertension during pregnancy have a greater chance of developing hypertension later in life or during future pregnancies. In some cases, hypertension during pregnancy can cause serious complications, such as: Stroke. Heart attack. Injury to other organs, such as kidneys, lungs, or  liver. Preeclampsia. A condition called hemolysis, elevated liver enzymes, and low platelet count (HELLP) syndrome. Convulsions or seizures. Placental abruption. How does this affect my baby? Hypertension during pregnancy can affect your baby. Your baby may: Be born early (prematurely). Not weigh as much as he or she should at birth (low birth weight). Not tolerate labor well, leading to an unplanned cesarean delivery. This condition may also result in a baby's death before birth (stillbirth). What are the risks? There are certain factors that make it more likely for you to develop hypertension during pregnancy. These include: Having hypertension during a previous pregnancy or a family history of hypertension. Being overweight. Being age 35 or older. Being pregnant for the first time. Being pregnant with more than one baby. Becoming pregnant using fertilization methods, such as IVF (in vitro fertilization). Having other medical problems, such as diabetes, kidney disease, or lupus. What can I do to lower my risk? The exact cause of hypertension during pregnancy is not known. You may be able to lower your risk by: Maintaining a healthy weight. Eating a healthy and balanced diet. Following your health care provider's instructions about treating any long-term conditions that you had before becoming pregnant. It is very important to keep all of your prenatal care appointments. Your health care provider will check your blood pressure and make sure that your pregnancy is progressing as expected. If a problem is found, early treatment can prevent complications. How is this treated? Treatment for hypertension during pregnancy varies depending on the type of hypertension you have and how serious it is. If you were taking medicine for high blood pressure before you became pregnant, talk with your health care provider. You may need to change medicine during pregnancy because some   medicines, like ACE  inhibitors, may not be considered safe for your baby. If you have gestational hypertension, your health care provider may order medicine to treat this during pregnancy. If you are at risk for preeclampsia, your health care provider may recommend that you take a low-dose aspirin during your pregnancy. If you have severe hypertension, you may need to be hospitalized so you and your baby can be monitored closely. You may also need to be given medicine to lower your blood pressure. In some cases, if your condition gets worse, you may need to deliver your baby early. Follow these instructions at home: Eating and drinking  Drink enough fluid to keep your urine pale yellow. Avoid caffeine. Lifestyle Do not use any products that contain nicotine or tobacco. These products include cigarettes, chewing tobacco, and vaping devices, such as e-cigarettes. If you need help quitting, ask your health care provider. Do not use alcohol or drugs. Avoid stress as much as possible. Rest and get plenty of sleep. Regular exercise can help to reduce your blood pressure. Ask your health care provider what kinds of exercise are best for you. General instructions Take over-the-counter and prescription medicines only as told by your health care provider. Keep all prenatal and follow-up visits. This is important. Contact a health care provider if: You have symptoms that your health care provider told you may require more treatment or monitoring, such as: Headaches. Nausea or vomiting. Abdominal pain. Dizziness. Light-headedness. Get help right away if: You have symptoms of serious complications, such as: Severe abdominal pain that does not get better with treatment. A severe headache that does not get better, blurred vision, or double vision. Vomiting that does not get better. Sudden, rapid weight gain or swelling in your hands, ankles, or face. Vaginal bleeding. Blood in your urine. Shortness of breath or chest  pain. Weakness on one side of your body or difficulty speaking. Your baby is not moving as much as usual. These symptoms may represent a serious problem that is an emergency. Do not wait to see if the symptoms will go away. Get medical help right away. Call your local emergency services (911 in the [place] Do not drive yourself to the hospital. Summary Hypertension during pregnancy can cause problems for you and your baby. Treatment for hypertension during pregnancy varies depending on the type of hypertension you have and how serious it is. Keep all prenatal and follow-up visits. This is important. Get help right away if you have symptoms of serious complications related to high blood pressure. This information is not intended to replace advice given to you by your health care provider. Make sure you discuss any questions you have with your health care provider. Document Revised: 09/17/2019 Document Reviewed: 09/17/2019 Elsevier Patient Education  2022 [employer]  

## 2020-07-18 ENCOUNTER — Ambulatory Visit: Payer: 59 | Admitting: *Deleted

## 2020-07-18 ENCOUNTER — Ambulatory Visit: Payer: 59 | Attending: Obstetrics

## 2020-07-18 ENCOUNTER — Other Ambulatory Visit: Payer: Self-pay | Admitting: *Deleted

## 2020-07-18 ENCOUNTER — Encounter: Payer: Self-pay | Admitting: *Deleted

## 2020-07-18 ENCOUNTER — Other Ambulatory Visit: Payer: Self-pay

## 2020-07-18 VITALS — BP 144/62 | HR 82

## 2020-07-18 DIAGNOSIS — O0990 Supervision of high risk pregnancy, unspecified, unspecified trimester: Secondary | ICD-10-CM

## 2020-07-18 DIAGNOSIS — Z3A23 23 weeks gestation of pregnancy: Secondary | ICD-10-CM

## 2020-07-18 DIAGNOSIS — O10919 Unspecified pre-existing hypertension complicating pregnancy, unspecified trimester: Secondary | ICD-10-CM

## 2020-07-18 DIAGNOSIS — Z362 Encounter for other antenatal screening follow-up: Secondary | ICD-10-CM | POA: Diagnosis not present

## 2020-07-18 DIAGNOSIS — O10012 Pre-existing essential hypertension complicating pregnancy, second trimester: Secondary | ICD-10-CM | POA: Diagnosis not present

## 2020-07-18 DIAGNOSIS — E669 Obesity, unspecified: Secondary | ICD-10-CM | POA: Diagnosis not present

## 2020-07-18 DIAGNOSIS — O321XX0 Maternal care for breech presentation, not applicable or unspecified: Secondary | ICD-10-CM

## 2020-07-18 DIAGNOSIS — O99212 Obesity complicating pregnancy, second trimester: Secondary | ICD-10-CM

## 2020-07-22 ENCOUNTER — Other Ambulatory Visit: Payer: Self-pay

## 2020-07-22 ENCOUNTER — Telehealth (INDEPENDENT_AMBULATORY_CARE_PROVIDER_SITE_OTHER): Payer: 59 | Admitting: Certified Nurse Midwife

## 2020-07-22 VITALS — BP 133/90 | HR 86 | Wt 184.0 lb

## 2020-07-22 DIAGNOSIS — Z3A24 24 weeks gestation of pregnancy: Secondary | ICD-10-CM

## 2020-07-22 DIAGNOSIS — O0990 Supervision of high risk pregnancy, unspecified, unspecified trimester: Secondary | ICD-10-CM

## 2020-07-22 DIAGNOSIS — O10912 Unspecified pre-existing hypertension complicating pregnancy, second trimester: Secondary | ICD-10-CM

## 2020-07-22 DIAGNOSIS — R4589 Other symptoms and signs involving emotional state: Secondary | ICD-10-CM

## 2020-07-22 DIAGNOSIS — O99891 Other specified diseases and conditions complicating pregnancy: Secondary | ICD-10-CM

## 2020-07-22 DIAGNOSIS — O10919 Unspecified pre-existing hypertension complicating pregnancy, unspecified trimester: Secondary | ICD-10-CM

## 2020-07-22 NOTE — Progress Notes (Signed)
OBSTETRICS PRENATAL VIRTUAL VISIT ENCOUNTER NOTE  Provider location: [facility]   Patient location: Home  I connected with Lisa Ritter on 07/22/20 at 11:10 AM EDT by MyChart Video Encounter and verified that I am speaking with the correct person using two identifiers. I discussed the limitations, risks, security and privacy concerns of performing an evaluation and management service virtually and the availability of in person appointments. I also discussed with the patient that there may be a patient responsible charge related to this service. The patient expressed understanding and agreed to proceed. Subjective:  Lisa Ritter is a 30 y.o. G3P0020 at [redacted]w[redacted]d being seen today for ongoing prenatal care.  She is currently monitored for the following issues for this high-risk pregnancy and has Supervision of high risk pregnancy, antepartum; Obesity affecting pregnancy, antepartum; History of miscarriage, currently pregnant; Elevated blood pressure affecting pregnancy, antepartum; and Chronic hypertension affecting pregnancy on their problem list.  Patient reports no complaints.  Contractions: Not present. Vag. Bleeding: None.  Movement: Present. Denies any leaking of fluid.   The following portions of the patient's history were reviewed and updated as appropriate: allergies, current medications, past family history, past medical history, past social history, past surgical history and problem list.   Objective:   Vitals:   07/22/20 1107  BP: 133/90  Pulse: 86  Weight: 184 lb (83.5 kg)    Fetal Status:     Movement: Present     General:  Alert, oriented and cooperative. Patient is in no acute distress.  Respiratory: Normal respiratory effort, no problems with respiration noted  Mental Status: Normal mood and affect. Normal behavior. Normal judgment and thought content.  Rest of physical exam deferred due to type of encounter  Imaging: [place] MFM OB FOLLOW  UP  Result Date: 07/18/2020 ----------------------------------------------------------------------  OBSTETRICS REPORT                       (Signed Final 07/18/2020 12:46 pm) ---------------------------------------------------------------------- Patient Info  ID #:       [id number]                          D.O.B.:  September 09, 1990 (29 yrs)  Name:       Lisa Ritter                    Visit Date: 07/18/2020 11:46 am ---------------------------------------------------------------------- Performed By  Attending:        Noralee Space MD        Secondary Phy.:   Dia Sitter                                                             DAWSON CNM  Performed By:     Eden Lathe BS      Address:          [address] [place]                    [place]T  [address]                                                             [place], [place]                                                             [address]  Referred By:      [facility]        [facility] ---------------------------------------------------------------------- Orders  #  Description                           Code        Ordered By  1  [place] MFM OB FOLLOW UP                   [phone]    Rosana Hoes ----------------------------------------------------------------------  #  Order #                     Accession #                Episode #  1  [id number]                   [id number]                 [id number] ---------------------------------------------------------------------- Indications  [redacted] weeks gestation of pregnancy                Z3A.23  Obesity complicating pregnancy, second         O99.212  trimester (BMI 32)  Antenatal follow-up for nonvisualized fetal    Z36.2  anatomy  LR NIPS   Hypertension - Chronic/Pre-existing            O10.019 ---------------------------------------------------------------------- Fetal Evaluation  Num Of Fetuses:         1  Fetal Heart Rate(bpm):  148  Cardiac Activity:  Observed  Presentation:           Breech  Placenta:               Anterior  P. Cord Insertion:      Visualized  Amniotic Fluid  AFI FV:      Within normal limits                              Largest Pocket(cm)                              6 ---------------------------------------------------------------------- Biometry  BPD:      57.9  mm     G. Age:  23w 5d         45  %    CI:         70.2   %    70 - 86                                                          FL/HC:      17.7   %    18.7 - 20.9  HC:      220.4  mm     G. Age:  24w 1d         47  %    HC/AC:      1.14        1.05 - 1.21  AC:      193.1  mm     G. Age:  24w 0d         51  %    FL/BPD:     67.4   %    71 - 87  FL:         39  mm     G. Age:  22w 4d          9  %    FL/AC:      20.2   %    20 - 24  HUM:      35.3  mm     G. Age:  22w 1d          7  %  LV:        5.7  mm  Est. FW:     596  gm      1 lb 5 oz     30  % ---------------------------------------------------------------------- OB History  Blood Type:   O+  Gravidity:    3         Term:   0        Prem:   0        SAB:   2  TOP:          0       Ectopic:  0        Living: 0 ---------------------------------------------------------------------- Gestational Age  LMP:           26w 1d        Date:  01/17/20                 EDD:   10/23/20  U/S Today:     23w 4d  EDD:   11/10/20  Best:          23w 5d     Det. By:  U/S  (05/23/20)          EDD:   11/09/20 ---------------------------------------------------------------------- Anatomy  Cranium:               Appears normal         LVOT:                   Appears normal  Cavum:                 Appears normal         Aortic Arch:            Appears normal  Ventricles:            Appears normal          Ductal Arch:            Appears normal  Choroid Plexus:        Previously seen        Diaphragm:              Appears normal  Cerebellum:            Previously seen        Stomach:                Appears normal, left                                                                        sided  Posterior Fossa:       Previously seen        Abdomen:                Appears normal  Nuchal Fold:           Previously seen        Abdominal Wall:         Appears nml (cord                                                                        insert, abd wall)  Face:                  Appears normal         Cord Vessels:           Appears normal (3                         (orbits and profile)                           vessel cord)  Lips:                  Appears normal         Kidneys:  Appear normal  Palate:                Limited                Bladder:                Appears normal  Thoracic:              Appears normal         Spine:                  Appears normal  Heart:                 Appears normal         Upper Extremities:      Previously seen                         (4CH, axis, and                         situs)  RVOT:                  Appears normal         Lower Extremities:      Previously seen ---------------------------------------------------------------------- Targeted Anatomy  Head/Neck  Nasal Bone:            Present                Mandible:               Appears normal  Orbits/Eyes:           Appears normal         Maxilla:                Appears normal  Thorax  SVC:                   Appears normal         IVC:                    Appears normal  3 Vessel View:         Appears normal  Extremities  Lt Hand:               Open hand nml          Lt Foot:                Appears normal  Rt Hand:               Open hand nml          Rt Foot:                Appears normal  Other  Genitalia:             Normal Female ----------------------------------------------------------------------  Cervix Uterus Adnexa  Cervix  Length:           3.86  cm.  Normal appearance by transabdominal scan.  Uterus  No abnormality visualized.  Right Ovary  Within normal limits.  Left Ovary  Within normal limits.  Cul De Sac  No free fluid seen.  Adnexa  No abnormality visualized. ---------------------------------------------------------------------- Impression  Patient returned for completion of fetal anatomy .Amniotic  fluid is normal and good fetal activity is seen .Fetal biometry  is consistent with her previously-established dates .Fetal  anatomical survey was completed  and appears normal.  BP at our office: 144/62 mm Hg . ---------------------------------------------------------------------- Recommendations  -An appointment was made for her to return in 4 weeks for  fetal growth assessment. ----------------------------------------------------------------------                  Noralee Space, MD Electronically Signed Final Report   07/18/2020 12:46 pm ----------------------------------------------------------------------   Assessment and Plan:  Pregnancy: G3P0020 at [redacted]w[redacted]d 1. Supervision of high risk pregnancy, antepartum - Doing well, feeling regular and vigorous fetal movement  2. [redacted] weeks gestation of pregnancy - Routine OB care  3. Chronic hypertension affecting pregnancy - BP has been consistently elevated for the last few readings >140/90 (either systolic or diastolic) - pt states she gets very anxious before appointments due to her two previous losses (6wks). - Explained the connection between anxiety induced hypertension and preeclampsia - Will schedule for repeat labs/BP check on Monday and begin low-dose antihypertensive ALT remains elevated/uptrends and BP >140/90. If down-trending labs, can consider starting on daily vistaril. Pt amenable to either option. Advised Venia Carbon, NP will be following up about her labs.  4. Feeling anxious - Gave reassurance about this pregnancy and  encouraged use of coping skills prior to visits. Can start on daily vistaril if needed.  Preterm labor symptoms and general obstetric precautions including but not limited to vaginal bleeding, contractions, leaking of fluid and fetal movement were reviewed in detail with the patient. I discussed the assessment and treatment plan with the patient. The patient was provided an opportunity to ask questions and all were answered. The patient agreed with the plan and demonstrated an understanding of the instructions. The patient was advised to call back or seek an in-person office evaluation/go to [facility] at [facility] for any urgent or concerning symptoms. Please refer to After Visit Summary for other counseling recommendations.   I provided 10 minutes of face-to-face time during this encounter.  Future Appointments  Date Time Provider Department Center  07/25/2020 10:10 AM [facility] NURSE [facility] None  08/16/2020  2:30 PM [facility] NURSE [facility]  08/16/2020  2:45 PM [facility] US5 [facility]  08/19/2020  8:30 AM Gerrit Heck, CNM [facility] None    Bernerd Limbo, CNM [facility] [employer], [facility]

## 2020-07-25 ENCOUNTER — Other Ambulatory Visit: Payer: Self-pay

## 2020-07-25 ENCOUNTER — Other Ambulatory Visit: Payer: Self-pay | Admitting: Obstetrics and Gynecology

## 2020-07-25 ENCOUNTER — Ambulatory Visit (INDEPENDENT_AMBULATORY_CARE_PROVIDER_SITE_OTHER): Payer: 59 | Admitting: *Deleted

## 2020-07-25 VITALS — BP 129/81 | HR 84 | Temp 98.5°F | Ht 64.0 in | Wt 183.6 lb

## 2020-07-25 DIAGNOSIS — O0990 Supervision of high risk pregnancy, unspecified, unspecified trimester: Secondary | ICD-10-CM

## 2020-07-25 DIAGNOSIS — O169 Unspecified maternal hypertension, unspecified trimester: Secondary | ICD-10-CM

## 2020-07-25 DIAGNOSIS — O10919 Unspecified pre-existing hypertension complicating pregnancy, unspecified trimester: Secondary | ICD-10-CM

## 2020-07-25 MED ORDER — LABETALOL HCL 200 MG PO TABS: 200.0000 mg | ORAL_TABLET | Freq: Two times a day (BID) | ORAL | 1 refills | Status: DC

## 2020-07-25 NOTE — Progress Notes (Signed)
    Subjective:  Lisa Ritter is a 30 y.o. female here for BP check.   Hypertension ROS: no TIA's, no chest pain on exertion, no dyspnea on exertion, no swelling of ankles, no orthostatic dizziness or lightheadedness, no orthopnea or paroxysmal nocturnal dyspnea, and no palpitations.    Objective:  BP (!) 145/81 (BP Location: Left Arm, Patient Position: Sitting, Cuff Size: Normal)   Pulse 83   Temp 98.5 F (36.9 C) (Oral)   Ht 5\' 4"  (1.626 m)   Wt 183 lb 9.6 oz (83.3 kg)   LMP 01/17/2020 (Exact Date)   BMI 31.51 kg/m   Repeat BP 129/81 HR 84 Appearance alert, well appearing, and in no distress, oriented to person, place, and time, and normal appearing weight. General exam BP noted to be well controlled today in office.    Assessment:   Blood Pressure asymptomatic, needs further observation, and needs improvement.   Plan:  Current treatment plan is effective, no change in therapy. Orders and follow up as documented in patient record. PEC labs completed.  03/16/2020, RN

## 2020-07-26 LAB — COMPREHENSIVE METABOLIC PANEL
ALT: 157 IU/L — ABNORMAL HIGH (ref 0–32)
AST: 46 IU/L — ABNORMAL HIGH (ref 0–40)
Albumin/Globulin Ratio: 1.5 (ref 1.2–2.2)
Albumin: 3.8 g/dL (ref 3.8–4.8)
Alkaline Phosphatase: 172 IU/L — ABNORMAL HIGH (ref 44–121)
BUN/Creatinine Ratio: 9 (ref 9–23)
BUN: 5 mg/dL — ABNORMAL LOW (ref 6–20)
Bilirubin Total: 0.2 mg/dL (ref 0.0–1.2)
CO2: 22 mmol/L (ref 20–29)
Calcium: 8.9 mg/dL (ref 8.7–10.2)
Chloride: 100 mmol/L (ref 96–106)
Creatinine, Ser: 0.55 mg/dL — ABNORMAL LOW (ref 0.57–1.00)
Globulin, Total: 2.6 g/dL (ref 1.5–4.5)
Glucose: 82 mg/dL (ref 65–99)
Potassium: 4.4 mmol/L (ref 3.5–5.2)
Sodium: 134 mmol/L (ref 134–144)
Total Protein: 6.4 g/dL (ref 6.0–8.5)
eGFR: 126 mL/min/{1.73_m2} (ref >59)

## 2020-07-26 LAB — PROTEIN / CREATININE RATIO, URINE
Creatinine, Urine: 161.1 mg/dL
Protein, Ur: 41.4 mg/dL
Protein/Creat Ratio: 257 mg/g creat — ABNORMAL HIGH (ref 0–200)

## 2020-07-26 LAB — CBC
Hematocrit: 35.2 % (ref 34.0–46.6)
Hemoglobin: 11.6 g/dL (ref 11.1–15.9)
MCH: 29.5 pg (ref 26.6–33.0)
MCHC: 33 g/dL (ref 31.5–35.7)
MCV: 90 fL (ref 79–97)
Platelets: 368 10*3/uL (ref 150–450)
RBC: 3.93 x10E6/uL (ref 3.77–5.28)
RDW: 12.2 % (ref 11.7–15.4)
WBC: 14.8 10*3/uL — ABNORMAL HIGH (ref 3.4–10.8)

## 2020-07-27 ENCOUNTER — Other Ambulatory Visit: Payer: Self-pay | Admitting: Obstetrics and Gynecology

## 2020-07-27 DIAGNOSIS — R748 Abnormal levels of other serum enzymes: Secondary | ICD-10-CM

## 2020-07-27 NOTE — Progress Notes (Signed)
Liver enzymes remain elevated. Reviewed with Dr. Alysia Penna, Liver US ordered. Reviewed with the patient and she is agreeable with the plan of care. Questions answered.  Venia Carbon I, NP 07/27/2020 6:07 PM

## 2020-08-08 ENCOUNTER — Other Ambulatory Visit: Payer: Self-pay

## 2020-08-08 ENCOUNTER — Ambulatory Visit (HOSPITAL_COMMUNITY)
Admission: RE | Admit: 2020-08-08 | Discharge: 2020-08-08 | Disposition: A | Discharge status: Home / Self Care | Payer: 59 | Source: Ambulatory Visit | Attending: Obstetrics and Gynecology | Admitting: Obstetrics and Gynecology

## 2020-08-08 DIAGNOSIS — R748 Abnormal levels of other serum enzymes: Secondary | ICD-10-CM | POA: Insufficient documentation

## 2020-08-10 ENCOUNTER — Telehealth: Payer: Self-pay | Admitting: Obstetrics & Gynecology

## 2020-08-10 NOTE — Telephone Encounter (Signed)
Attempted to reach patient about her care being transferred to another office for her High Risk pregnancy. Left message about the office she will now be going to. [facility]. Left phone number for her to call with any questions she might have.

## 2020-08-16 ENCOUNTER — Other Ambulatory Visit: Payer: Self-pay | Admitting: *Deleted

## 2020-08-16 ENCOUNTER — Encounter: Payer: Self-pay | Admitting: *Deleted

## 2020-08-16 ENCOUNTER — Ambulatory Visit: Payer: 59 | Admitting: *Deleted

## 2020-08-16 ENCOUNTER — Ambulatory Visit: Payer: 59 | Attending: Obstetrics and Gynecology

## 2020-08-16 ENCOUNTER — Other Ambulatory Visit: Payer: Self-pay

## 2020-08-16 VITALS — BP 142/66 | HR 81

## 2020-08-16 DIAGNOSIS — O10919 Unspecified pre-existing hypertension complicating pregnancy, unspecified trimester: Secondary | ICD-10-CM | POA: Diagnosis not present

## 2020-08-16 DIAGNOSIS — Z362 Encounter for other antenatal screening follow-up: Secondary | ICD-10-CM | POA: Diagnosis not present

## 2020-08-16 DIAGNOSIS — O99212 Obesity complicating pregnancy, second trimester: Secondary | ICD-10-CM | POA: Diagnosis not present

## 2020-08-16 DIAGNOSIS — O0990 Supervision of high risk pregnancy, unspecified, unspecified trimester: Secondary | ICD-10-CM

## 2020-08-16 DIAGNOSIS — E669 Obesity, unspecified: Secondary | ICD-10-CM

## 2020-08-16 DIAGNOSIS — O10012 Pre-existing essential hypertension complicating pregnancy, second trimester: Secondary | ICD-10-CM

## 2020-08-16 DIAGNOSIS — Z3A27 27 weeks gestation of pregnancy: Secondary | ICD-10-CM

## 2020-08-19 ENCOUNTER — Other Ambulatory Visit: Payer: 59

## 2020-08-19 ENCOUNTER — Other Ambulatory Visit: Payer: Self-pay

## 2020-08-19 ENCOUNTER — Ambulatory Visit (INDEPENDENT_AMBULATORY_CARE_PROVIDER_SITE_OTHER): Payer: 59 | Admitting: Obstetrics & Gynecology

## 2020-08-19 VITALS — BP 127/76 | HR 86 | Wt 191.0 lb

## 2020-08-19 DIAGNOSIS — O0990 Supervision of high risk pregnancy, unspecified, unspecified trimester: Secondary | ICD-10-CM

## 2020-08-19 DIAGNOSIS — O10919 Unspecified pre-existing hypertension complicating pregnancy, unspecified trimester: Secondary | ICD-10-CM

## 2020-08-19 DIAGNOSIS — O99210 Obesity complicating pregnancy, unspecified trimester: Secondary | ICD-10-CM

## 2020-08-19 NOTE — Progress Notes (Signed)
   PRENATAL VISIT NOTE  Subjective:  Lisa Ritter is a 30 y.o. G3P0020 at [redacted]w[redacted]d being seen today for ongoing prenatal care.  She is currently monitored for the following issues for this high-risk pregnancy and has Supervision of high risk pregnancy, antepartum; Obesity affecting pregnancy, antepartum; History of miscarriage, currently pregnant; Elevated blood pressure affecting pregnancy, antepartum; and Chronic hypertension affecting pregnancy on their problem list.  Patient reports no complaints.  Contractions: Not present. Vag. Bleeding: None.  Movement: Present. Denies leaking of fluid.   The following portions of the patient's history were reviewed and updated as appropriate: allergies, current medications, past family history, past medical history, past social history, past surgical history and problem list.   Objective:   Vitals:   08/19/20 0834  BP: 127/76  Pulse: 86  Weight: 191 lb (86.6 kg)    Fetal Status: Fetal Heart Rate (bpm): 145   Movement: Present     General:  Alert, oriented and cooperative. Patient is in no acute distress.  Skin: Skin is warm and dry. No rash noted.   Cardiovascular: Normal heart rate noted  Respiratory: Normal respiratory effort, no problems with respiration noted  Abdomen: Soft, gravid, appropriate for gestational age.  Pain/Pressure: Present     Pelvic: Cervical exam deferred        Extremities: Normal range of motion.     Mental Status: Normal mood and affect. Normal behavior. Normal judgment and thought content.   Assessment and Plan:  Pregnancy: G3P0020 at [redacted]w[redacted]d 1. Chronic hypertension affecting pregnancy   2. Supervision of high risk pregnancy, antepartum Normal BP  3. Obesity affecting pregnancy, antepartum   Preterm labor symptoms and general obstetric precautions including but not limited to vaginal bleeding, contractions, leaking of fluid and fetal movement were reviewed in detail with the patient. Please refer to After Visit  Summary for other counseling recommendations.   Return in about 2 weeks (around 09/02/2020) for 2 hr GTT adn CMP.  Future Appointments  Date Time Provider Department Center  09/14/2020 12:30 PM [facility] NURSE [facility]  09/14/2020 12:45 PM [facility]  09/20/2020 11:15 AM [facility] NURSE [facility]  09/20/2020 11:30 AM [facility]    Scheryl Darter, MD

## 2020-08-22 ENCOUNTER — Other Ambulatory Visit: Payer: Self-pay

## 2020-08-22 ENCOUNTER — Other Ambulatory Visit: Payer: 59

## 2020-08-22 DIAGNOSIS — O0990 Supervision of high risk pregnancy, unspecified, unspecified trimester: Secondary | ICD-10-CM

## 2020-08-23 LAB — CBC
Hematocrit: 32.8 % — ABNORMAL LOW (ref 34.0–46.6)
Hemoglobin: 11 g/dL — ABNORMAL LOW (ref 11.1–15.9)
MCH: 29.3 pg (ref 26.6–33.0)
MCHC: 33.5 g/dL (ref 31.5–35.7)
MCV: 88 fL (ref 79–97)
Platelets: 303 10*3/uL (ref 150–450)
RBC: 3.75 x10E6/uL — ABNORMAL LOW (ref 3.77–5.28)
RDW: 12.2 % (ref 11.7–15.4)
WBC: 4.3 10*3/uL (ref 3.4–10.8)

## 2020-08-23 LAB — HIV ANTIBODY (ROUTINE TESTING W REFLEX): HIV Screen 4th Generation wRfx: NONREACTIVE

## 2020-08-23 LAB — GLUCOSE TOLERANCE, 2 HOURS W/ 1HR
Glucose, 1 hour: 230 mg/dL — ABNORMAL HIGH (ref 65–179)
Glucose, 2 hour: 176 mg/dL — ABNORMAL HIGH (ref 65–152)
Glucose, Fasting: 91 mg/dL (ref 65–91)

## 2020-08-23 LAB — RPR: RPR Ser Ql: NONREACTIVE

## 2020-08-24 ENCOUNTER — Encounter: Payer: Self-pay | Admitting: Obstetrics and Gynecology

## 2020-08-24 ENCOUNTER — Telehealth: Payer: Self-pay

## 2020-08-24 ENCOUNTER — Other Ambulatory Visit: Payer: Self-pay

## 2020-08-24 DIAGNOSIS — Z8632 Personal history of gestational diabetes: Secondary | ICD-10-CM

## 2020-08-24 DIAGNOSIS — O24410 Gestational diabetes mellitus in pregnancy, diet controlled: Secondary | ICD-10-CM | POA: Insufficient documentation

## 2020-08-24 DIAGNOSIS — O24419 Gestational diabetes mellitus in pregnancy, unspecified control: Secondary | ICD-10-CM

## 2020-08-24 HISTORY — DX: Personal history of gestational diabetes: Z86.32

## 2020-08-24 MED ORDER — ACCU-CHEK SOFTCLIX LANCETS MISC: 1.0000 | Freq: Four times a day (QID) | 12 refills | Status: DC

## 2020-08-24 MED ORDER — ACCU-CHEK GUIDE W/DEVICE KIT: 1.0000 | PACK | Freq: Four times a day (QID) | 0 refills | Status: DC

## 2020-08-24 MED ORDER — ACCU-CHEK GUIDE VI STRP: ORAL_STRIP | 12 refills | Status: DC

## 2020-08-24 NOTE — Telephone Encounter (Signed)
TC to pt regarding results pt not ava LVM.   

## 2020-08-24 NOTE — Telephone Encounter (Signed)
-----   Message from Warden Fillers, MD sent at 08/24/2020  9:11 AM EDT ----- Failed 2 hour GTT, pt needs diabetic teaching, remainder of 29 week labs were normal

## 2020-08-24 NOTE — Progress Notes (Signed)
GDM referral order placed and supplies sent as advised.

## 2020-09-02 ENCOUNTER — Encounter: Payer: Self-pay | Admitting: Obstetrics and Gynecology

## 2020-09-02 ENCOUNTER — Other Ambulatory Visit: Payer: Self-pay

## 2020-09-02 ENCOUNTER — Ambulatory Visit (INDEPENDENT_AMBULATORY_CARE_PROVIDER_SITE_OTHER): Payer: 59 | Admitting: Obstetrics and Gynecology

## 2020-09-02 VITALS — BP 117/74 | HR 79 | Wt 193.0 lb

## 2020-09-02 DIAGNOSIS — O0990 Supervision of high risk pregnancy, unspecified, unspecified trimester: Secondary | ICD-10-CM

## 2020-09-02 DIAGNOSIS — O99210 Obesity complicating pregnancy, unspecified trimester: Secondary | ICD-10-CM

## 2020-09-02 DIAGNOSIS — O24410 Gestational diabetes mellitus in pregnancy, diet controlled: Secondary | ICD-10-CM

## 2020-09-02 DIAGNOSIS — O10919 Unspecified pre-existing hypertension complicating pregnancy, unspecified trimester: Secondary | ICD-10-CM

## 2020-09-02 NOTE — Progress Notes (Signed)
   PRENATAL VISIT NOTE  Subjective:  Lisa Ritter is a 30 y.o. G3P0020 at [redacted]w[redacted]d being seen today for ongoing prenatal care.  She is currently monitored for the following issues for this high-risk pregnancy and has Supervision of high risk pregnancy, antepartum; Obesity affecting pregnancy, antepartum; History of miscarriage, currently pregnant; Elevated blood pressure affecting pregnancy, antepartum; Chronic hypertension affecting pregnancy; and Diet controlled gestational diabetes mellitus (GDM) in third trimester on their problem list.  Patient reports no complaints.  Contractions: Not present. Vag. Bleeding: None.  Movement: Present. Denies leaking of fluid.   The following portions of the patient's history were reviewed and updated as appropriate: allergies, current medications, past family history, past medical history, past social history, past surgical history and problem list.   Objective:   Vitals:   09/02/20 0903  BP: 117/74  Pulse: 79  Weight: 193 lb (87.5 kg)    Fetal Status: Fetal Heart Rate (bpm): 139 Fundal Height: 31 cm Movement: Present     General:  Alert, oriented and cooperative. Patient is in no acute distress.  Skin: Skin is warm and dry. No rash noted.   Cardiovascular: Normal heart rate noted  Respiratory: Normal respiratory effort, no problems with respiration noted  Abdomen: Soft, gravid, appropriate for gestational age.  Pain/Pressure: Absent     Pelvic: Cervical exam deferred        Extremities: Normal range of motion.  Edema: Trace  Mental Status: Normal mood and affect. Normal behavior. Normal judgment and thought content.   Assessment and Plan:  Pregnancy: G3P0020 at [redacted]w[redacted]d 1. Supervision of high risk pregnancy, antepartum Patient is doing well without complaints Patient undecided on contraception and pediatrician- list provided  2. Chronic hypertension affecting pregnancy Stable on labetalol 200 BID Continue ASA  3. Diet controlled gestational  diabetes mellitus (GDM) in third trimester Patient has not had diabetic education- most of her fasting values are within range over the past 3 days. Postprandial are as high as 178 Patient scheduled for education early next week Reviewed diet with patient and importance of glycemic control in pregnancy Follow up ultrasound scheduled per MFM  4. Obesity affecting pregnancy, antepartum Continue ASA  Preterm labor symptoms and general obstetric precautions including but not limited to vaginal bleeding, contractions, leaking of fluid and fetal movement were reviewed in detail with the patient. Please refer to After Visit Summary for other counseling recommendations.   Return in about 2 weeks (around 09/16/2020) for in person, ROB, High risk.  Future Appointments  Date Time Provider Department Center  09/07/2020  2:45 PM [facility]DM CLASS [facility]  09/14/2020 12:30 PM [facility] NURSE [facility]  09/14/2020 12:45 PM [facility] US4 [facility]  09/20/2020 11:15 AM [facility] NURSE [facility]  09/20/2020 11:30 AM [facility] US3 [facility]    Catalina Antigua, MD

## 2020-09-02 NOTE — Patient Instructions (Signed)
AREA PEDIATRIC/FAMILY PRACTICE PHYSICIANS  [place] ([address]) [facility] Chambliss, MD; Eniola, MD; Hale, MD; Hensel, MD; McDiarmid, MD; McIntyer, MD; Neal, MD; Walden, MD [address] [place], [place] [address] [phone] Mon-Fri 8:30-12:30, 1:30-5:00 Providers come to see babies at [facility] Accepting Medicaid [facility] providers who accept newborns: Koirala, MD; Morrow, MD; Wolters, MD [address], [place], [place] [address] [phone] Mon-Fri 8:00-5:30 Babies seen by providers at [facility] Does NOT accept Medicaid Please call early in hospitalization for appointment (limited availability)  [facility] Mulberry, MD [address] [place], [place] [address] [phone] Mon, Tue, Thur, Fri 8:30-5:00, Wed 10:00-7:00 (closed 1-2pm) Babies seen by [facility] providers Accepting Medicaid Rubin - Pediatrician Rubin, MD [address], [place], [place] [address] [phone] Mon-Fri 8:30-5:00, Sat 8:30-12:00 Provider comes to see babies at [facility] Accepting Medicaid Must have been referred from current patients or contacted office prior to delivery [facility] ([facility]) Brown, MD; Chandler, MD; Ettefagh, MD; Grant, MD; Lester, MD; McCormick, MD; McQueen, MD; Prose, MD; Simha, MD; Stanley, MD; Stryffeler, NP; Tebben, NP [address], [place], [place] [address] [phone] Mon, Tue, Thur, Fri 8:30-5:30, Wed 9:30-5:30, Sat 8:30-12:30 Babies seen by [facility] providers Accepting Medicaid Only accepting infants of first-time parents or siblings of current patients Hospital discharge coordinator will make follow-up appointment Jack Amos [address], [place], [place]  [address] [phone]   Fax - [phone] [facility] [address], [place], [place]  [address] Phone - [phone]   Fax - [phone] Shilpa Gosrani [address], [place], [place]  [address] [phone]  [place] ([address]) [facility] Bates, MD; Brassfield, MD; Cooper, Cox, MD; MD; Davis, MD; Dovico, MD; Ettefaugh, MD; Little, MD; Lowe, MD; Keiffer, MD; Melvin, MD; Sumner, MD; Williams, MD [address], [place], [place] [address] [phone] Mon-Fri 8:30-5:00 (extended evenings Mon-Thur as needed), Sat-Sun 10:00-1:00 Providers come to see babies at [facility] Accepting Medicaid for families of first-time babies and families with all children in the household age 3 and under. Must register with office prior to making appointment (M-F only). [facility] Henson, NP; Knapp, MD; Lalonde, MD; Tysinger, PA [address] [place], [place] [address] [phone] Mon-Fri 8:00-5:00 Babies seen by providers at [facility] Does NOT accept Medicaid/Commercial Insurance Only [facility] [place] ([facility])  Artis, MD; Barnes, MD; Bratton, MD; Coccaro, MD; Lockett Gardner, MD; Kramer, MD; Marshall, MD; Netherton, MD; Poleto, MD; Skinner, MD [address] [place], [place] [address] [phone] Mon-Fri 8:30-5:30, Sat (Oct.-Mar.) 9:00-1:00 Babies seen by providers at [facility] Accepting Medicaid  [place] ([address]) [facility] Reid, MD; Warner, MD [address], [place], [place] [address] [phone] Mon-Fri 8:30-5:00, Sat 8:30-12:00 Providers come to see babies at [facility] Does NOT accept Medicaid [facility] Becker, PA; Hagler, MD; Scifres, PA; Sun, MD; Swayne, MD [address], , [place] [address] [phone] Mon-Fri 8:00-5:00 Babies seen by providers at [facility] Does NOT accept Medicaid Only accepting babies of parents who  are patients Please call early in hospitalization for appointment (limited availability)  Pediatricians Clark, MD; Frye, MD; Kelleher, MD; Mack, NP; Miller, MD; O'Keller, MD; Patterson, NP; Pudlo, MD; Puzio, MD; Thomas, MD; Tucker, MD; Twiselton, MD [address], [place], [place] [address] [phone] Mon-Fri 8:00-5:00, Sat 9:00-12:00 Providers come to see babies at [facility] Does NOT accept Medicaid  [place] ([address]) [facility] providers accepting new patients: Brake, NP; Wharton, PA [address], [place], [place] [address] [phone] Mon-Fri 8:00-5:00 Babies seen by providers at [facility] Does NOT accept Medicaid Only accepting babies of parents who are patients Please call early in hospitalization for appointment (limited availability) [facility] Gay, MD; Quinlan, MD [address] [place], [place] [address] [phone] (press 1 to schedule appointment) Mon-Fri 8:00-5:00 Providers come to see babies at [facility] Does NOT accept Medicaid [facility] Mazer, MD [address] [place], [place] [address] [phone] Mon-Fri 8:30-5:00 (lunch 12:30-1:00), extended hours by appointment only Wed 5:00-6:30 Babies seen by [facility] providers Accepting Medicaid [facility] Banks, MD; Jordan, MD; Koberlein, MD [address], [place], [place] [address] [phone] Mon-Fri 8:00-5:00 Babies seen by [facility] providers Does NOT accept Medicaid [facility] Parker, MD; Hunter, MD; Wallace, DO [address] [place], [place] [address] [phone] Mon-Fri 8:00-5:00 Babies seen by [facility] providers Does NOT accept Medicaid [facility] [place], PA; Brecken, PA; Christy, NP; Dees, MD; DeClaire, MD; DeWeese, MD; Hansen, NP; Mills, NP; Parrish, NP; Smoot, NP; Summer, MD; Vapne,  MD [address] [place], [place] [address] [phone] Mon-Fri 8:30-5:00, Sat 10:00-1:00 Providers come to see babies at [facility] Does NOT accept Medicaid Free prenatal information session Tuesdays at 4:45pm [facility] Bouska, MD; Gordon, PA; Jeffery, PA; Weber, PA [address] [place] [address] [phone] Mon-Fri 7:30-5:30 Babies seen by [facility] providers [facility] [address], [place], [place]  [address] [phone]   Fax - [phone]  [place] ([address] & [address]) [facility] [place] [address] [place], [place] [address] [phone] Mon-Thur 8:00-6:00 Providers come to see babies at [facility] Accepting Medicaid [facility] Anderson, NP; Badger, MD; Beal, PA; Spencer, PA [address] [place] [place], [place] [address] [phone] Mon-Thur 7:30-7:30, Fri 7:30-4:30 Babies seen by [facility] providers Accepting Medicaid [facility] Agbuya, MD; Klett, NP; Romgoolam, MD [address], [place], [place] [address] [phone] Mon-Fri 8:30-5:00, Sat 8:30-12:00 Providers come to see babies at [facility] Accepting Medicaid Must have "Meet & Greet" appointment at office prior to delivery [facility] ([facility] [place] [facility]) McCord, MD; Wallace, MD; Wood, MD [address], [place], [place] [address] [phone] Mon-Wed 8:00-6:00, Thur-Fri 8:00-5:00, Sat 9:00-12:00 Providers come to see babies at [facility] Does NOT accept Medicaid Only accepting siblings of current patients [facility]  [address], [place], [place]  [address] [phone]   Fax - [phone] [facility] [address], [place], [place]  [address] [phone]   Fax -  [phone]  [place] ([address] & [address]) [facility] Cirigliano, DO; Matthews, DO [address] [place], [place] [address] [phone] Mon-Fri 7:00-5:00 Babies seen by [facility] providers Does NOT accept Medicaid [facility] Briscoe, MD; Howley, PA; Moreira, PA [address], [place], [place] [address] [phone] Mon-Fri 8:00-5:00 Babies seen by [facility] providers Accepting Medicaid [facility] Boyd, MD; Church, PA; Jones, NP; Osborn, PA [address], , [place] [address] [phone] Mon-Fri 8:00-5:00 Babies seen by providers at [facility] Accepting Medicaid  [place] ([address]) [facility] [place] Wendling, DO [address] [place], [place] [address] [phone] Mon-Fri 8:00-5:00 Babies seen by [facility] providers Does NOT accept Medicaid Limited availability, please call early in hospitalization to schedule follow-up [facility] Calderon, PA; Cummings, MD; Dillard, MD; Martin, PA; Olson, MD; VanDeven, PA [address], [place], [place] [address] [phone] Mon-Fri 8:30-5:00, Sat 9:00-12:00 Babies seen by providers at [facility] Accepting Medicaid Please register online then schedule online or call office [email or web address] Hunter, NP; Kumar, MD; Martin Rogers, PA [address], [place], [place] [address] [phone] Mon-Fri 8:00-5:00 Babies seen by providers at [facility] Accepting Medicaid [facility] ([facility]) [place], MD; Kristi Fleenor, NP; West, MD [address], [place], [place] [address] [phone] Mon-Fri 8:00-5:30, Sat&Sun by appointment (phones open at  8:30) Babies seen by [facility] providers Accepting Medicaid Must be a first-time baby or sibling of current patient [facility] [place]  [address], [place], [place]  [address] [phone]   Fax - [phone]  [place] ([address]) [facility] Brown, PA; Cowen, PA; Rice, MD; Helton, PA; Spry, MD [address] [place], [place] [address] [phone] Mon-Thur 8:00-7:00, Fri 8:00-5:00, Sat 8:00-12:00, Sun 9:00-12:00 Babies seen by [facility] providers Accepting Medicaid [facility] [place] Coe-Goins, MD; Marshall, MD; Pierre-Louis, MD [address], [place], [place] [address] [phone] Mon-Thur 8:00-5:00 Babies seen by providers at [facility] Accepting Medicaid [facility] Bratton, MD; Coe-Goins, MD; Hayes, MD; Lewis, MD; List, MD; Lott, MD; Marshall, MD; Moran, MD; O'Neal, MD; Pierre-Louis, MD; Pitonzo, MD; Scholer, MD; Spangle, MD [address] [place], [place] [address] [phone] Mon-Fri 8:00-5:30, Sat (Oct.-Mar.) 9:00-1:00 Babies seen by providers at [facility] Accepting Medicaid Must fill out new patient packet, available online at [email or web address] [facility] ([facility]) Friddle, NP; Harris, NP; Kelly, NP; Logan, MD; Melvin, PA; Poth, MD; Ramadoss, MD; Stanton, NP [address], [place], [place] [address] [phone] Mon-Thur 8:00-5:30, Fri 8:00-5:00 Babies seen by providers at [facility] Accepting Medicaid  [place] ([address]) [facility] Dixon, PA; [place], MD; Pickard, MD; Tapia, PA [address], [place], [place] [address] [phone] Mon-Fri 8:00-5:00 Babies seen by providers at [facility] Accepting Medicaid   [place] ([address]) [facility] [place] Masneri, DO; Meyers, MD; Nelson, PA [address], [place], [place] [address] [phone] Mon-Fri 8:00-5:00 Babies seen by providers at [facility] Does NOT accept Medicaid Limited appointment availability, please call early in hospitalization  [facility] [place] Kunedd, DO; McGowen, MD [address], [place], [place] [address] [phone] Mon-Fri 8:00-5:00 Babies seen by [facility] providers Does NOT accept Medicaid [facility] [place] Cameron, MD; MacDonald, MD; Michaels, PA; Nayak, MD [address] [place] [address], [place], [place] [address] [phone] Mon-Fri 8:00-5:00 After hours clinic ([address] [place],  [address]) [phone] Mon-Fri 5:00-8:00, Sat 12:00-6:00, Sun 10:00-4:00 Babies seen by [facility] providers Accepting Medicaid [facility] [place] [address], [place], [place]  [address] [phone]   Fax - [phone]  [place] ([address]) [facility] [place] [facility] Andy, MD [address], [place], [place] [address] [phone] Mon-Fri 8:00-5:00 Babies seen by [facility] providers Does NOT accept Medicaid [facility] [place] ([facility] [place]) Eksir, MD [address], [place], [place] [address] [phone] Mon-Thur 8:00-7:00, Fri 8:00-5:00, Sat 8:00-12:00 Babies seen by providers at [facility] Accepting Medicaid - but does not have vaccinations in office (must be received elsewhere) Limited availability, please call early in hospitalization  [place] ([address]) [facility]  Charlene Flemming, MD [address],  [place] [address] [phone]  Fax [phone]  [place] [facility]  Kimberly Newton, MD, Annamarie Streilein, PA, Carla Hampton, PA [address] [place], [place]  [address] [phone] [facility]  [address], [place], [place] [address] [phone] [address], [place], [place] [address] [phone] (West Office)  [facility] [address], [place], [place] [address] [phone] [facility] [address], [place], [place] [address] [phone] [facility] [address], [place], [place] [address] [phone] [facility] [address], [place], [place] [address] [phone] [facility] [address], [place], [place] [address] [phone] [facility] [address], [place], [place] [address] [phone] [facility]  [address], [place], [place] [address] [phone] Dr[facility] [address], [place], [place] [address] [phone] [facility] [address], [place], [place] [address] [phone] [facility] [address], [place], [place] [address] [phone]  

## 2020-09-02 NOTE — Progress Notes (Signed)
+   Fetal movement. No complaints. Pt states blood sugars are running well. Fasting this morning was 95. Pt has logs to review.

## 2020-09-07 ENCOUNTER — Other Ambulatory Visit: Payer: Self-pay

## 2020-09-07 ENCOUNTER — Encounter: Payer: 59 | Attending: Obstetrics and Gynecology | Admitting: Registered"

## 2020-09-07 DIAGNOSIS — O24410 Gestational diabetes mellitus in pregnancy, diet controlled: Secondary | ICD-10-CM | POA: Diagnosis present

## 2020-09-08 ENCOUNTER — Encounter: Payer: Self-pay | Admitting: *Deleted

## 2020-09-09 ENCOUNTER — Encounter: Payer: Self-pay | Admitting: Registered"

## 2020-09-09 NOTE — Progress Notes (Signed)
Patient was seen on 09/07/20 for Gestational Diabetes self-management class [facility]. The following learning objectives were met by the patient during this course:  States the definition of Gestational Diabetes States why dietary management is important in controlling blood glucose Describes the effects each nutrient has on blood glucose levels Demonstrates ability to create a balanced meal plan Demonstrates carbohydrate counting  States when to check blood glucose levels Demonstrates proper blood glucose monitoring techniques States the effect of stress and exercise on blood glucose levels States the importance of limiting caffeine and abstaining from alcohol and smoking  Blood glucose monitor given: Patient has meter and is checking blood sugar prior to class   Patient instructed to monitor glucose levels: FBS: 60 - <95; 1 hour: <140; 2 hour: <120  Patient received handouts: Nutrition Diabetes and Pregnancy, including carb counting list  Patient will be seen for follow-up as needed. 

## 2020-09-14 ENCOUNTER — Ambulatory Visit: Payer: 59 | Attending: Obstetrics

## 2020-09-14 ENCOUNTER — Other Ambulatory Visit: Payer: Self-pay

## 2020-09-14 ENCOUNTER — Ambulatory Visit: Payer: 59 | Admitting: *Deleted

## 2020-09-14 ENCOUNTER — Encounter: Payer: Self-pay | Admitting: *Deleted

## 2020-09-14 VITALS — BP 131/65 | HR 76

## 2020-09-14 DIAGNOSIS — O10013 Pre-existing essential hypertension complicating pregnancy, third trimester: Secondary | ICD-10-CM

## 2020-09-14 DIAGNOSIS — O10919 Unspecified pre-existing hypertension complicating pregnancy, unspecified trimester: Secondary | ICD-10-CM | POA: Diagnosis present

## 2020-09-14 DIAGNOSIS — Z362 Encounter for other antenatal screening follow-up: Secondary | ICD-10-CM | POA: Diagnosis not present

## 2020-09-14 DIAGNOSIS — O99213 Obesity complicating pregnancy, third trimester: Secondary | ICD-10-CM

## 2020-09-14 DIAGNOSIS — Z3A32 32 weeks gestation of pregnancy: Secondary | ICD-10-CM

## 2020-09-14 DIAGNOSIS — O0990 Supervision of high risk pregnancy, unspecified, unspecified trimester: Secondary | ICD-10-CM | POA: Insufficient documentation

## 2020-09-14 DIAGNOSIS — O24419 Gestational diabetes mellitus in pregnancy, unspecified control: Secondary | ICD-10-CM

## 2020-09-14 DIAGNOSIS — E669 Obesity, unspecified: Secondary | ICD-10-CM

## 2020-09-15 ENCOUNTER — Other Ambulatory Visit: Payer: Self-pay | Admitting: *Deleted

## 2020-09-15 ENCOUNTER — Encounter: Payer: 59 | Admitting: Obstetrics & Gynecology

## 2020-09-15 DIAGNOSIS — O10913 Unspecified pre-existing hypertension complicating pregnancy, third trimester: Secondary | ICD-10-CM

## 2020-09-20 ENCOUNTER — Encounter: Payer: Self-pay | Admitting: *Deleted

## 2020-09-20 ENCOUNTER — Ambulatory Visit: Payer: 59 | Attending: Obstetrics

## 2020-09-20 ENCOUNTER — Ambulatory Visit: Payer: 59 | Admitting: *Deleted

## 2020-09-20 ENCOUNTER — Other Ambulatory Visit: Payer: Self-pay

## 2020-09-20 ENCOUNTER — Ambulatory Visit (INDEPENDENT_AMBULATORY_CARE_PROVIDER_SITE_OTHER): Payer: 59 | Admitting: Obstetrics & Gynecology

## 2020-09-20 VITALS — BP 131/77 | HR 79 | Wt 195.0 lb

## 2020-09-20 VITALS — BP 136/69 | HR 70

## 2020-09-20 DIAGNOSIS — O99212 Obesity complicating pregnancy, second trimester: Secondary | ICD-10-CM

## 2020-09-20 DIAGNOSIS — O10919 Unspecified pre-existing hypertension complicating pregnancy, unspecified trimester: Secondary | ICD-10-CM | POA: Diagnosis present

## 2020-09-20 DIAGNOSIS — Z3A32 32 weeks gestation of pregnancy: Secondary | ICD-10-CM

## 2020-09-20 DIAGNOSIS — O0990 Supervision of high risk pregnancy, unspecified, unspecified trimester: Secondary | ICD-10-CM

## 2020-09-20 DIAGNOSIS — E669 Obesity, unspecified: Secondary | ICD-10-CM | POA: Diagnosis not present

## 2020-09-20 DIAGNOSIS — O24410 Gestational diabetes mellitus in pregnancy, diet controlled: Secondary | ICD-10-CM | POA: Diagnosis not present

## 2020-09-20 DIAGNOSIS — O10012 Pre-existing essential hypertension complicating pregnancy, second trimester: Secondary | ICD-10-CM

## 2020-09-20 MED ORDER — METFORMIN HCL 500 MG PO TABS: 500.0000 mg | ORAL_TABLET | Freq: Two times a day (BID) | ORAL | 5 refills | Status: DC

## 2020-09-20 NOTE — Progress Notes (Signed)
   PRENATAL VISIT NOTE  Subjective:  Lisa Ritter is a 30 y.o. G3P0020 at [redacted]w[redacted]d being seen today for ongoing prenatal care.  She is currently monitored for the following issues for this high-risk pregnancy and has Supervision of high risk pregnancy, antepartum; Obesity affecting pregnancy, antepartum; History of miscarriage, currently pregnant; Elevated blood pressure affecting pregnancy, antepartum; Chronic hypertension affecting pregnancy; and Diet controlled gestational diabetes mellitus (GDM) in third trimester on their problem list.  Patient reports no complaints.  Contractions: Not present. Vag. Bleeding: None.  Movement: Present. Denies leaking of fluid.   The following portions of the patient's history were reviewed and updated as appropriate: allergies, current medications, past family history, past medical history, past social history, past surgical history and problem list.   Objective:   Vitals:   09/20/20 1437  BP: 131/77  Pulse: 79  Weight: 195 lb (88.5 kg)    Fetal Status: Fetal Heart Rate (bpm): 136   Movement: Present     General:  Alert, oriented and cooperative. Patient is in no acute distress.  Skin: Skin is warm and dry. No rash noted.   Cardiovascular: Normal heart rate noted  Respiratory: Normal respiratory effort, no problems with respiration noted  Abdomen: Soft, gravid, appropriate for gestational age.  Pain/Pressure: Absent     Pelvic: Cervical exam deferred        Extremities: Normal range of motion.  Edema: Trace  Mental Status: Normal mood and affect. Normal behavior. Normal judgment and thought content.   Assessment and Plan:  Pregnancy: G3P0020 at [redacted]w[redacted]d 1. Diet controlled gestational diabetes mellitus (GDM) in third trimester FBS 101-110 and PP up to 150-206, need oral medication and possibly insulin if difficult to control - metFORMIN (GLUCOPHAGE) 500 MG tablet; Take 1 tablet (500 mg total) by mouth 2 (two) times daily with a meal.  Dispense: 60  tablet; Refill: 5  2. Chronic hypertension affecting pregnancy Acceptable control  3. Supervision of high risk pregnancy, antepartum Weekly BPP, follow growth  Preterm labor symptoms and general obstetric precautions including but not limited to vaginal bleeding, contractions, leaking of fluid and fetal movement were reviewed in detail with the patient. Please refer to After Visit Summary for other counseling recommendations.   Return in about 1 week (around 09/27/2020).  Future Appointments  Date Time Provider Department Center  09/28/2020  8:30 AM [facility] NURSE [facility]  09/28/2020  8:45 AM [facility] US5 [facility]  10/05/2020 12:30 PM [facility] NURSE [facility]  10/05/2020 12:45 PM [facility] US6 [facility]  10/12/2020 10:00 AM [facility] NURSE [facility]  10/12/2020 10:15 AM [facility] US2 [facility]  10/19/2020 11:15 AM [facility] NURSE [facility]  10/19/2020 11:30 AM [facility] US3 [facility]    Scheryl Darter, MD

## 2020-09-20 NOTE — Progress Notes (Signed)
+   fetal movement. No complaints. Pt states blood sugars are running ok. Pt has glucose logs for reviews.

## 2020-09-27 ENCOUNTER — Encounter: Payer: 59 | Admitting: Obstetrics & Gynecology

## 2020-09-28 ENCOUNTER — Ambulatory Visit: Payer: 59 | Attending: Obstetrics and Gynecology

## 2020-09-28 ENCOUNTER — Ambulatory Visit (INDEPENDENT_AMBULATORY_CARE_PROVIDER_SITE_OTHER): Payer: 59 | Admitting: Obstetrics and Gynecology

## 2020-09-28 ENCOUNTER — Other Ambulatory Visit: Payer: Self-pay

## 2020-09-28 ENCOUNTER — Encounter: Payer: Self-pay | Admitting: Obstetrics and Gynecology

## 2020-09-28 ENCOUNTER — Encounter: Payer: Self-pay | Admitting: *Deleted

## 2020-09-28 ENCOUNTER — Ambulatory Visit: Payer: 59 | Admitting: *Deleted

## 2020-09-28 VITALS — BP 135/81 | HR 79 | Wt 193.0 lb

## 2020-09-28 VITALS — BP 119/54 | HR 79

## 2020-09-28 DIAGNOSIS — O10913 Unspecified pre-existing hypertension complicating pregnancy, third trimester: Secondary | ICD-10-CM | POA: Diagnosis present

## 2020-09-28 DIAGNOSIS — O0990 Supervision of high risk pregnancy, unspecified, unspecified trimester: Secondary | ICD-10-CM

## 2020-09-28 DIAGNOSIS — Z3A34 34 weeks gestation of pregnancy: Secondary | ICD-10-CM

## 2020-09-28 DIAGNOSIS — O10013 Pre-existing essential hypertension complicating pregnancy, third trimester: Secondary | ICD-10-CM

## 2020-09-28 DIAGNOSIS — O24410 Gestational diabetes mellitus in pregnancy, diet controlled: Secondary | ICD-10-CM

## 2020-09-28 DIAGNOSIS — O10919 Unspecified pre-existing hypertension complicating pregnancy, unspecified trimester: Secondary | ICD-10-CM

## 2020-09-28 NOTE — Progress Notes (Signed)
Subjective:  Lisa Ritter is a 30 y.o. G3P0020 at 93w0dbeing seen today for ongoing prenatal care.  She is currently monitored for the following issues for this high-risk pregnancy and has Supervision of high risk pregnancy, antepartum; Obesity affecting pregnancy, antepartum; History of miscarriage, currently pregnant; Chronic hypertension affecting pregnancy; and Diet controlled gestational diabetes mellitus (GDM) in third trimester on their problem list.  Patient reports general discomforts of pregnancy.  Contractions: Not present. Vag. Bleeding: None.  Movement: Present. Denies leaking of fluid.   The following portions of the patient's history were reviewed and updated as appropriate: allergies, current medications, past family history, past medical history, past social history, past surgical history and problem list. Problem list updated.  Objective:   Vitals:   09/28/20 1455  BP: 135/81  Pulse: 79  Weight: 193 lb (87.5 kg)    Fetal Status: Fetal Heart Rate (bpm): 150   Movement: Present     General:  Alert, oriented and cooperative. Patient is in no acute distress.  Skin: Skin is warm and dry. No rash noted.   Cardiovascular: Normal heart rate noted  Respiratory: Normal respiratory effort, no problems with respiration noted  Abdomen: Soft, gravid, appropriate for gestational age. Pain/Pressure: Absent     Pelvic:  Cervical exam deferred        Extremities: Normal range of motion.  Edema: None  Mental Status: Normal mood and affect. Normal behavior. Normal judgment and thought content.   Urinalysis:      Assessment and Plan:  Pregnancy: G3P0020 at 348w0d1. Supervision of high risk pregnancy, antepartum Stable GBS and vaginal cultures next visit  2. Chronic hypertension affecting pregnancy BP stable on Labetalol Continue with weekly BPP's and serial growth scans as per MFM - CBC - Comp Met (CMET)  3. Diet controlled gestational diabetes mellitus (GDM) in third  trimester CBG's in goal range for most part. Started Bid Metformin last week. Diet and exersize reviewed Serial growth scans and weekly BPP as noted above  Preterm labor symptoms and general obstetric precautions including but not limited to vaginal bleeding, contractions, leaking of fluid and fetal movement were reviewed in detail with the patient. Please refer to After Visit Summary for other counseling recommendations.  Return in about 2 weeks (around 10/12/2020) for OB visit, face to face, MD only.   ErChancy MilroyMD

## 2020-09-28 NOTE — Patient Instructions (Signed)

## 2020-09-29 LAB — CBC
Hematocrit: 33.8 % — ABNORMAL LOW (ref 34.0–46.6)
Hemoglobin: 11.3 g/dL (ref 11.1–15.9)
MCH: 28.7 pg (ref 26.6–33.0)
MCHC: 33.4 g/dL (ref 31.5–35.7)
MCV: 86 fL (ref 79–97)
Platelets: 351 10*3/uL (ref 150–450)
RBC: 3.94 x10E6/uL (ref 3.77–5.28)
RDW: 13 % (ref 11.7–15.4)
WBC: 12.3 10*3/uL — ABNORMAL HIGH (ref 3.4–10.8)

## 2020-09-29 LAB — COMPREHENSIVE METABOLIC PANEL
ALT: 87 IU/L — ABNORMAL HIGH (ref 0–32)
AST: 39 IU/L (ref 0–40)
Albumin/Globulin Ratio: 1.3 (ref 1.2–2.2)
Albumin: 3.5 g/dL — ABNORMAL LOW (ref 3.8–4.8)
Alkaline Phosphatase: 184 IU/L — ABNORMAL HIGH (ref 44–121)
BUN/Creatinine Ratio: 15 (ref 9–23)
BUN: 8 mg/dL (ref 6–20)
Bilirubin Total: 0.2 mg/dL (ref 0.0–1.2)
CO2: 20 mmol/L (ref 20–29)
Calcium: 9.4 mg/dL (ref 8.7–10.2)
Chloride: 100 mmol/L (ref 96–106)
Creatinine, Ser: 0.53 mg/dL — ABNORMAL LOW (ref 0.57–1.00)
Globulin, Total: 2.8 g/dL (ref 1.5–4.5)
Glucose: 80 mg/dL (ref 65–99)
Potassium: 4.2 mmol/L (ref 3.5–5.2)
Sodium: 133 mmol/L — ABNORMAL LOW (ref 134–144)
Total Protein: 6.3 g/dL (ref 6.0–8.5)
eGFR: 127 mL/min/{1.73_m2} (ref >59)

## 2020-10-05 ENCOUNTER — Encounter: Payer: Self-pay | Admitting: *Deleted

## 2020-10-05 ENCOUNTER — Ambulatory Visit: Payer: 59 | Attending: Obstetrics and Gynecology

## 2020-10-05 ENCOUNTER — Ambulatory Visit: Payer: 59 | Admitting: *Deleted

## 2020-10-05 ENCOUNTER — Other Ambulatory Visit: Payer: Self-pay

## 2020-10-05 VITALS — BP 131/68 | HR 71

## 2020-10-05 DIAGNOSIS — O10913 Unspecified pre-existing hypertension complicating pregnancy, third trimester: Secondary | ICD-10-CM | POA: Insufficient documentation

## 2020-10-05 DIAGNOSIS — O24410 Gestational diabetes mellitus in pregnancy, diet controlled: Secondary | ICD-10-CM

## 2020-10-05 DIAGNOSIS — O99213 Obesity complicating pregnancy, third trimester: Secondary | ICD-10-CM | POA: Diagnosis not present

## 2020-10-05 DIAGNOSIS — Z3A35 35 weeks gestation of pregnancy: Secondary | ICD-10-CM

## 2020-10-05 DIAGNOSIS — E669 Obesity, unspecified: Secondary | ICD-10-CM | POA: Diagnosis not present

## 2020-10-05 DIAGNOSIS — O0990 Supervision of high risk pregnancy, unspecified, unspecified trimester: Secondary | ICD-10-CM

## 2020-10-05 DIAGNOSIS — O10013 Pre-existing essential hypertension complicating pregnancy, third trimester: Secondary | ICD-10-CM

## 2020-10-12 ENCOUNTER — Other Ambulatory Visit: Payer: Self-pay

## 2020-10-12 ENCOUNTER — Ambulatory Visit (INDEPENDENT_AMBULATORY_CARE_PROVIDER_SITE_OTHER): Payer: 59 | Admitting: Obstetrics & Gynecology

## 2020-10-12 ENCOUNTER — Other Ambulatory Visit (HOSPITAL_COMMUNITY)
Admission: RE | Admit: 2020-10-12 | Discharge: 2020-10-12 | Disposition: A | Discharge status: Home / Self Care | Payer: 59 | Source: Ambulatory Visit | Attending: Obstetrics & Gynecology | Admitting: Obstetrics & Gynecology

## 2020-10-12 ENCOUNTER — Other Ambulatory Visit: Payer: Self-pay | Admitting: Advanced Practice Midwife

## 2020-10-12 ENCOUNTER — Encounter: Payer: Self-pay | Admitting: *Deleted

## 2020-10-12 ENCOUNTER — Ambulatory Visit (HOSPITAL_BASED_OUTPATIENT_CLINIC_OR_DEPARTMENT_OTHER): Payer: 59

## 2020-10-12 ENCOUNTER — Ambulatory Visit: Payer: 59 | Admitting: *Deleted

## 2020-10-12 VITALS — BP 121/63 | HR 83

## 2020-10-12 VITALS — BP 120/76 | HR 89 | Wt 201.1 lb

## 2020-10-12 DIAGNOSIS — E669 Obesity, unspecified: Secondary | ICD-10-CM

## 2020-10-12 DIAGNOSIS — O10919 Unspecified pre-existing hypertension complicating pregnancy, unspecified trimester: Secondary | ICD-10-CM

## 2020-10-12 DIAGNOSIS — O0990 Supervision of high risk pregnancy, unspecified, unspecified trimester: Secondary | ICD-10-CM

## 2020-10-12 DIAGNOSIS — O10913 Unspecified pre-existing hypertension complicating pregnancy, third trimester: Secondary | ICD-10-CM

## 2020-10-12 DIAGNOSIS — O24415 Gestational diabetes mellitus in pregnancy, controlled by oral hypoglycemic drugs: Secondary | ICD-10-CM | POA: Diagnosis not present

## 2020-10-12 DIAGNOSIS — Z3A36 36 weeks gestation of pregnancy: Secondary | ICD-10-CM

## 2020-10-12 DIAGNOSIS — Z362 Encounter for other antenatal screening follow-up: Secondary | ICD-10-CM

## 2020-10-12 DIAGNOSIS — O99213 Obesity complicating pregnancy, third trimester: Secondary | ICD-10-CM

## 2020-10-12 DIAGNOSIS — O10013 Pre-existing essential hypertension complicating pregnancy, third trimester: Secondary | ICD-10-CM

## 2020-10-12 LAB — OB RESULTS CONSOLE GC/CHLAMYDIA: Gonorrhea: NEGATIVE

## 2020-10-12 NOTE — Progress Notes (Signed)
   PRENATAL VISIT NOTE  Subjective:  Lisa Ritter is a 30 y.o. G3P0020 at [redacted]w[redacted]d being seen today for ongoing prenatal care.  She is currently monitored for the following issues for this high-risk pregnancy and has Supervision of high risk pregnancy, antepartum; Obesity affecting pregnancy, antepartum; History of miscarriage, currently pregnant; Chronic hypertension affecting pregnancy; and Gestational diabetes mellitus on their problem list.  Patient reports no complaints.  Contractions: Not present. Vag. Bleeding: None.  Movement: Present. Denies leaking of fluid.   The following portions of the patient's history were reviewed and updated as appropriate: allergies, current medications, past family history, past medical history, past social history, past surgical history and problem list.   Objective:   Vitals:   10/12/20 1342  BP: 120/76  Pulse: 89  Weight: 201 lb 1.6 oz (91.2 kg)    Fetal Status: Fetal Heart Rate (bpm): 134   Movement: Present  Presentation: Vertex  General:  Alert, oriented and cooperative. Patient is in no acute distress.  Skin: Skin is warm and dry. No rash noted.   Cardiovascular: Normal heart rate noted  Respiratory: Normal respiratory effort, no problems with respiration noted  Abdomen: Soft, gravid, appropriate for gestational age.  Pain/Pressure: Present     Pelvic: Cervical exam performed in the presence of a chaperone Dilation: Fingertip Effacement (%): 30 Station: Ballotable  Extremities: Normal range of motion.  Edema: None  Mental Status: Normal mood and affect. Normal behavior. Normal judgment and thought content.   Assessment and Plan:  Pregnancy: G3P0020 at [redacted]w[redacted]d 1. Chronic hypertension affecting pregnancy BP well controlled  2. Supervision of high risk pregnancy, antepartum 11% growth BPP 10/10 - Cervicovaginal ancillary only( [place]) - Strep Gp B Culture+Rflx IOL 37 weeks 3. Gestational diabetes mellitus (GDM) in third trimester  controlled on oral hypoglycemic drug FBS 90's with PP > 150 on metformin.   Preterm labor symptoms and general obstetric precautions including but not limited to vaginal bleeding, contractions, leaking of fluid and fetal movement were reviewed in detail with the patient. Please refer to After Visit Summary for other counseling recommendations.   Return in about 1 week (around 10/19/2020).  Future Appointments  Date Time Provider Department Center  10/19/2020 11:15 AM [facility] NURSE [facility]  10/19/2020 11:30 AM [facility] US3 [facility]    Scheryl Darter, MD

## 2020-10-12 NOTE — Progress Notes (Signed)
ROB 36 wks GBS and GC/CC today No complaints Has CBG logs. Saw MFM today, recommendations for delivery at 37 wks.

## 2020-10-13 ENCOUNTER — Telehealth (HOSPITAL_COMMUNITY): Payer: Self-pay | Admitting: *Deleted

## 2020-10-13 LAB — CERVICOVAGINAL ANCILLARY ONLY
Chlamydia: NEGATIVE
Comment: NEGATIVE
Comment: NORMAL
Neisseria Gonorrhea: NEGATIVE

## 2020-10-13 NOTE — Telephone Encounter (Signed)
Preadmission screen  

## 2020-10-14 ENCOUNTER — Telehealth (HOSPITAL_COMMUNITY): Payer: Self-pay | Admitting: *Deleted

## 2020-10-14 NOTE — Telephone Encounter (Signed)
Preadmission screen  

## 2020-10-16 LAB — STREP GP B CULTURE+RFLX: Strep Gp B Culture+Rflx: NEGATIVE

## 2020-10-17 ENCOUNTER — Other Ambulatory Visit (HOSPITAL_COMMUNITY): Payer: Self-pay | Admitting: *Deleted

## 2020-10-19 ENCOUNTER — Other Ambulatory Visit: Payer: Self-pay | Admitting: Obstetrics and Gynecology

## 2020-10-19 ENCOUNTER — Inpatient Hospital Stay (HOSPITAL_COMMUNITY)
Admission: AD | Admit: 2020-10-19 | Discharge: 2020-10-23 | DRG: 788 | Disposition: A | Discharge status: Home / Self Care | Payer: 59 | Attending: Obstetrics and Gynecology | Admitting: Obstetrics and Gynecology

## 2020-10-19 ENCOUNTER — Encounter (HOSPITAL_COMMUNITY): Payer: Self-pay | Admitting: Family Medicine

## 2020-10-19 ENCOUNTER — Encounter: Payer: Self-pay | Admitting: *Deleted

## 2020-10-19 ENCOUNTER — Encounter: Payer: 59 | Admitting: Obstetrics and Gynecology

## 2020-10-19 ENCOUNTER — Ambulatory Visit: Payer: 59 | Admitting: *Deleted

## 2020-10-19 ENCOUNTER — Other Ambulatory Visit: Payer: Self-pay

## 2020-10-19 ENCOUNTER — Ambulatory Visit (HOSPITAL_BASED_OUTPATIENT_CLINIC_OR_DEPARTMENT_OTHER): Payer: 59

## 2020-10-19 VITALS — BP 132/58 | HR 82

## 2020-10-19 DIAGNOSIS — O1092 Unspecified pre-existing hypertension complicating childbirth: Secondary | ICD-10-CM | POA: Diagnosis not present

## 2020-10-19 DIAGNOSIS — O99214 Obesity complicating childbirth: Secondary | ICD-10-CM | POA: Diagnosis present

## 2020-10-19 DIAGNOSIS — Z3A37 37 weeks gestation of pregnancy: Secondary | ICD-10-CM

## 2020-10-19 DIAGNOSIS — O99213 Obesity complicating pregnancy, third trimester: Secondary | ICD-10-CM | POA: Diagnosis not present

## 2020-10-19 DIAGNOSIS — Z7982 Long term (current) use of aspirin: Secondary | ICD-10-CM | POA: Diagnosis not present

## 2020-10-19 DIAGNOSIS — O1002 Pre-existing essential hypertension complicating childbirth: Principal | ICD-10-CM | POA: Diagnosis present

## 2020-10-19 DIAGNOSIS — O26893 Other specified pregnancy related conditions, third trimester: Secondary | ICD-10-CM | POA: Diagnosis present

## 2020-10-19 DIAGNOSIS — O0990 Supervision of high risk pregnancy, unspecified, unspecified trimester: Secondary | ICD-10-CM

## 2020-10-19 DIAGNOSIS — Z20822 Contact with and (suspected) exposure to covid-19: Secondary | ICD-10-CM | POA: Diagnosis present

## 2020-10-19 DIAGNOSIS — O24425 Gestational diabetes mellitus in childbirth, controlled by oral hypoglycemic drugs: Secondary | ICD-10-CM | POA: Diagnosis present

## 2020-10-19 DIAGNOSIS — O24419 Gestational diabetes mellitus in pregnancy, unspecified control: Secondary | ICD-10-CM | POA: Diagnosis present

## 2020-10-19 DIAGNOSIS — O10913 Unspecified pre-existing hypertension complicating pregnancy, third trimester: Secondary | ICD-10-CM | POA: Insufficient documentation

## 2020-10-19 DIAGNOSIS — E669 Obesity, unspecified: Secondary | ICD-10-CM

## 2020-10-19 DIAGNOSIS — Z8632 Personal history of gestational diabetes: Secondary | ICD-10-CM | POA: Diagnosis present

## 2020-10-19 DIAGNOSIS — O24415 Gestational diabetes mellitus in pregnancy, controlled by oral hypoglycemic drugs: Secondary | ICD-10-CM | POA: Diagnosis not present

## 2020-10-19 DIAGNOSIS — O10013 Pre-existing essential hypertension complicating pregnancy, third trimester: Secondary | ICD-10-CM

## 2020-10-19 DIAGNOSIS — O99210 Obesity complicating pregnancy, unspecified trimester: Secondary | ICD-10-CM | POA: Diagnosis present

## 2020-10-19 DIAGNOSIS — O10919 Unspecified pre-existing hypertension complicating pregnancy, unspecified trimester: Secondary | ICD-10-CM

## 2020-10-19 DIAGNOSIS — O24420 Gestational diabetes mellitus in childbirth, diet controlled: Secondary | ICD-10-CM | POA: Diagnosis not present

## 2020-10-19 LAB — CBC
HCT: 34.1 % — ABNORMAL LOW (ref 36.0–46.0)
Hemoglobin: 11.8 g/dL — ABNORMAL LOW (ref 12.0–15.0)
MCH: 29.3 pg (ref 26.0–34.0)
MCHC: 34.6 g/dL (ref 30.0–36.0)
MCV: 84.6 fL (ref 80.0–100.0)
Platelets: 357 10*3/uL (ref 150–400)
RBC: 4.03 MIL/uL (ref 3.87–5.11)
RDW: 12.8 % (ref 11.5–15.5)
WBC: 12.7 10*3/uL — ABNORMAL HIGH (ref 4.0–10.5)
nRBC: 0 % (ref 0.0–0.2)

## 2020-10-19 LAB — COMPREHENSIVE METABOLIC PANEL
ALT: 38 U/L (ref 0–44)
AST: 30 U/L (ref 15–41)
Albumin: 2.8 g/dL — ABNORMAL LOW (ref 3.5–5.0)
Alkaline Phosphatase: 170 U/L — ABNORMAL HIGH (ref 38–126)
Anion gap: 12 (ref 5–15)
BUN: 8 mg/dL (ref 6–20)
CO2: 21 mmol/L — ABNORMAL LOW (ref 22–32)
Calcium: 9 mg/dL (ref 8.9–10.3)
Chloride: 100 mmol/L (ref 98–111)
Creatinine, Ser: 0.72 mg/dL (ref 0.44–1.00)
GFR, Estimated: >60 mL/min (ref >60)
Glucose, Bld: 117 mg/dL — ABNORMAL HIGH (ref 70–99)
Potassium: 3.7 mmol/L (ref 3.5–5.1)
Sodium: 133 mmol/L — ABNORMAL LOW (ref 135–145)
Total Bilirubin: 0.4 mg/dL (ref 0.3–1.2)
Total Protein: 6.5 g/dL (ref 6.5–8.1)

## 2020-10-19 LAB — TYPE AND SCREEN
ABO/RH(D): O POS
Antibody Screen: NEGATIVE

## 2020-10-19 LAB — GLUCOSE, CAPILLARY
Glucose-Capillary: 85 mg/dL (ref 70–99)
Glucose-Capillary: 113 mg/dL — ABNORMAL HIGH (ref 70–99)

## 2020-10-19 LAB — RESP PANEL BY RT-PCR (FLU A&B, COVID) ARPGX2
Influenza A by PCR: NEGATIVE
Influenza B by PCR: NEGATIVE
SARS Coronavirus 2 by RT PCR: NEGATIVE

## 2020-10-19 LAB — PROTEIN / CREATININE RATIO, URINE
Creatinine, Urine: 43.05 mg/dL
Total Protein, Urine: <6 mg/dL

## 2020-10-19 MED ORDER — OXYTOCIN-SODIUM CHLORIDE 30-0.9 UT/500ML-% IV SOLN
1.0000 m[IU]/min | INTRAVENOUS | Status: DC
  Administered 2020-10-19: 2 m[IU]/min via INTRAVENOUS
  Administered 2020-10-20: 8 m[IU]/min via INTRAVENOUS

## 2020-10-19 MED ORDER — LABETALOL HCL 200 MG PO TABS
200.0000 mg | ORAL_TABLET | Freq: Two times a day (BID) | ORAL | Status: DC
  Administered 2020-10-19 – 2020-10-20 (×2): 200 mg via ORAL
  Filled 2020-10-19 (×2): qty 1

## 2020-10-19 MED ORDER — LACTATED RINGERS IV SOLN
INTRAVENOUS | Status: DC
Start: 2020-10-19 — End: 2020-10-20

## 2020-10-19 MED ORDER — OXYTOCIN-SODIUM CHLORIDE 30-0.9 UT/500ML-% IV SOLN
2.5000 [IU]/h | INTRAVENOUS | Status: DC
Start: 2020-10-19 — End: 2020-10-20
  Filled 2020-10-19: qty 500

## 2020-10-19 MED ORDER — TERBUTALINE SULFATE 1 MG/ML IJ SOLN
0.2500 mg | Freq: Once | INTRAMUSCULAR | Status: AC | PRN
  Administered 2020-10-20: 0.25 mg via SUBCUTANEOUS
  Filled 2020-10-19: qty 1

## 2020-10-19 MED ORDER — METFORMIN HCL 500 MG PO TABS
500.0000 mg | ORAL_TABLET | Freq: Two times a day (BID) | ORAL | Status: DC
  Administered 2020-10-20: 500 mg via ORAL
  Filled 2020-10-19 (×4): qty 1

## 2020-10-19 MED ORDER — SOD CITRATE-CITRIC ACID 500-334 MG/5ML PO SOLN
30.0000 mL | ORAL | Status: DC | PRN
Start: 2020-10-19 — End: 2020-10-20
  Administered 2020-10-20: 30 mL via ORAL
  Filled 2020-10-19: qty 30

## 2020-10-19 MED ORDER — LIDOCAINE HCL (PF) 1 % IJ SOLN: 30.0000 mL | INTRAMUSCULAR | Status: DC | PRN

## 2020-10-19 MED ORDER — FENTANYL CITRATE (PF) 100 MCG/2ML IJ SOLN
100.0000 ug | INTRAMUSCULAR | Status: DC | PRN
  Administered 2020-10-20 (×2): 100 ug via INTRAVENOUS
  Filled 2020-10-19 (×2): qty 2

## 2020-10-19 MED ORDER — LACTATED RINGERS IV SOLN
500.0000 mL | INTRAVENOUS | Status: DC | PRN
  Administered 2020-10-20: 1000 mL via INTRAVENOUS

## 2020-10-19 MED ORDER — OXYTOCIN BOLUS FROM INFUSION
333.0000 mL | Freq: Once | INTRAVENOUS | Status: DC
Start: 2020-10-19 — End: 2020-10-20

## 2020-10-19 MED ORDER — OXYCODONE-ACETAMINOPHEN 5-325 MG PO TABS: 2.0000 | ORAL_TABLET | ORAL | Status: DC | PRN

## 2020-10-19 MED ORDER — OXYCODONE-ACETAMINOPHEN 5-325 MG PO TABS: 1.0000 | ORAL_TABLET | ORAL | Status: DC | PRN

## 2020-10-19 MED ORDER — ONDANSETRON HCL 4 MG/2ML IJ SOLN
4.0000 mg | Freq: Four times a day (QID) | INTRAMUSCULAR | Status: DC | PRN
Start: 2020-10-19 — End: 2020-10-20
  Administered 2020-10-20: 4 mg via INTRAVENOUS
  Filled 2020-10-19: qty 2

## 2020-10-19 MED ORDER — ACETAMINOPHEN 325 MG PO TABS
650.0000 mg | ORAL_TABLET | ORAL | Status: DC | PRN
Start: 2020-10-19 — End: 2020-10-20

## 2020-10-19 NOTE — H&P (Addendum)
OBSTETRIC ADMISSION HISTORY AND PHYSICAL  Lisa Ritter is a 30 y.o. female [id number] with IUP at 68w0dby 15wk [place] for IOL for BPP 4/8. She reports +FMs, No LOF, no VB, no blurry vision, headaches or peripheral edema, and RUQ pain.  She plans on breast feeding. She is undecided on birth control. [facility]  Dating: By 15wk [place]--->  Estimated Date of Delivery: 11/09/20  Sono:    Today _0 , CWD, normal anatomy, cephalic presentation, placenta anterior,   At 316w0d 2369g, 11% EFW, normal anatomy, cephalic presentation, placenta anterior   Prenatal History/Complications: cHTN, GDMA2  Past Medical History: Past Medical History:  Diagnosis Date   Medical history non-contributory     Past Surgical History: Past Surgical History:  Procedure Laterality Date   NO PAST SURGERIES      Obstetrical History: OB History     Gravida  3   Para      Term      Preterm      AB  2   Living         SAB  2   IAB      Ectopic      Multiple      Live Births              Social History Social History   Socioeconomic History   Marital status: Married    Spouse name: StBarnabas Lister Number of children: Not on file   Years of education: Not on file   Highest education level: High school graduate  Occupational History    Comment: Work from Home  Tobacco Use   Smoking status: Never   Smokeless tobacco: Never  Vaping Use   Vaping Use: Never used  Substance and Sexual Activity   Alcohol use: Never   Drug use: Never   Sexual activity: Yes    Birth control/protection: None  Other Topics Concern   Not on file  Social History Narrative   Not on file   Social Determinants of Health   Financial Resource Strain: Not on file  Food Insecurity: No Food Insecurity   Worried About RuCharity fundraisern the Last Year: Never true   Ran Out of Food in the Last Year: Never true  Transportation Needs: Not on file  Physical Activity: Not on  file  Stress: Not on file  Social Connections: Not on file    Family History: Family History  Problem Relation Age of Onset   Diabetes Father     Allergies: Allergies  Allergen Reactions   Penicillins Other (See Comments)    Unsure of reactions    Medications Prior to Admission  Medication Sig Dispense Refill Last Dose   Accu-Chek Softclix Lancets lancets 1 each by Other route 4 (four) times daily. 100 each 12 10/19/2020   aspirin 81 MG chewable tablet Chew 1 tablet (81 mg total) by mouth daily. 30 tablet 6 10/19/2020   Blood Glucose Monitoring Suppl (ACCU-CHEK GUIDE) w/Device KIT 1 Device by Does not apply route 4 (four) times daily. 1 kit 0 10/19/2020   glucose blood (ACCU-CHEK GUIDE) test strip Use to check blood sugars four times a day was instructed 100 each 12 10/19/2020   labetalol (NORMODYNE) 200 MG tablet Take 1 tablet (200 mg total) by mouth 2 (two) times daily. 90 tablet 1 10/19/2020   metFORMIN (GLUCOPHAGE) 500 MG tablet Take 1 tablet (500 mg total) by mouth 2 (two)  times daily with a meal. 60 tablet 5 10/19/2020   Prenatal Vit-Fe Fumarate-FA (MULTIVITAMIN-PRENATAL) 27-0.8 MG TABS tablet Take 1 tablet by mouth daily at 12 noon.   10/19/2020     Review of Systems   All systems reviewed and negative except as stated in HPI  Last menstrual period 01/17/2020. General appearance: alert, cooperative, and no distress Lungs: normal work of breathing Heart: regular rate and rhythm Abdomen: gravid Pelvic: cervical exam below Extremities: no pedal edema no sign of DVT Presentation: cephalic Fetal monitoringBaseline: 150 bpm, Variability: Good {> 6 bpm), Accelerations: not seen, and Decelerations: intermittent variable seen Uterine activity: none perceived by pt, ~1 per hour on toco Dilation: 1 Effacement (%): 50 Station: -2, -3 Exam by:: Danielle Simpson, CNM   Prenatal labs: ABO, Rh: O/Positive/-- (03/31 1447) Antibody: Negative (03/31 1447) Rubella: 2.41 (03/31  1447) RPR: Non Reactive (08/15 1113)  HBsAg: Negative (03/31 1447)  HIV: Non Reactive (08/15 1113)  GBS: Negative/-- (10/05 1448)  1 hr Glucola failed Genetic screening  NIPS low risk, AFP neg, Horizon neg Anatomy US normal  Prenatal Transfer Tool  Maternal Diabetes: Yes:  Diabetes Type:  Insulin/Medication controlled Genetic Screening: Normal Maternal Ultrasounds/Referrals: Normal Fetal Ultrasounds or other Referrals:  10/19/20 umbilical artery doppler with increased S/D ratio Maternal Substance Abuse:  No Significant Maternal Medications:  no Significant Maternal Lab Results: Group B Strep negative  No results found for this or any previous visit (from the past 24 hour(s)).  Patient Active Problem List   Diagnosis Date Noted   Gestational diabetes mellitus 08/24/2020   Chronic hypertension affecting pregnancy 06/02/2020   Supervision of high risk pregnancy, antepartum 04/07/2020   Obesity affecting pregnancy, antepartum 04/07/2020   History of miscarriage, currently pregnant 04/07/2020    Assessment/Plan:  Lisa Ritter is a 30 y.o. G3P0020 at [redacted]w[redacted]d here for IOL for BPP 4/8  #Labor: not in labor. Placed foley bulb. Holding off on Cytotec for now due to borderline fetal status. Will start pitocin. #cHTN: continue home labetalol 200mg bid #GDMA2: Taking metformin at home. q4hr glucose  #Pain: Considering IV pain medicine vs epidural #FWB: Cat II #ID:  GBS neg #MOF: breast #MOC: undecided #Circ:  yes  Andrew K Marburg, PGY-1, Faculty Service 10/19/2020, 4:04 PM    Attestation of CNM Supervision of Resident: Evaluation and management procedures were performed by the Family Medicine Resident under my supervision. I was immediately available for direct supervision, assistance and direction throughout this encounter.  I also confirm that I have verified the information documented in the resident's note, and that I have also personally reperformed the pertinent components of  the physical exam and all of the medical decision making activities.  I have also made any necessary editorial changes.  IOL: Foley balloon placement by me. Pitocin 2/2 to be titrated prn per nursing policy.  BP's normotensive to mild range, asymptomatic, labs pending. Will continue labetalol 200mg BID CBG's q4h for A2GDM    Danielle L Simpson, CNM 10/19/2020 4:50 PM   

## 2020-10-19 NOTE — Progress Notes (Signed)
Labor Progress Note Lisa Ritter is a 30 y.o. G3P0020 at [redacted]w[redacted]d who presented for IOL due to [facility] 4/8.  S: Doing well, no concerns.  O:  BP 111/76   Pulse 74   Temp 98.1 F (36.7 C) (Oral)   Resp 17   LMP 01/17/2020 (Exact Date)   EFM: Baseline 140 bpm, moderate variability, + accels, no decels  CVE: Dilation: 1 Effacement (%): 50 Station: -2 Presentation: Vertex Exam by:: E Koster   A&P: 30 y.o. G3P0020 [redacted]w[redacted]d   #Labor: FB remains in place. Pitocin at 8 milli-units/min. Will continue at current rate until FB comes out.  #Pain: PRN; planning for epidural when ready #FWB: Cat 1  #GBS negative  #CHTN: Normal range BP. On home Labetalol 200 mg BID. Pre-E labs normal on admission. Will continue to monitor.   #A2GDM: Normal glucose since admission. On home Metformin 500 mg BID. Continue q4hr glucose checks.  Worthy Rancher, MD 8:48 PM

## 2020-10-20 ENCOUNTER — Encounter (HOSPITAL_COMMUNITY)
Admission: AD | Disposition: A | Discharge status: Home / Self Care | Payer: Self-pay | Source: Home / Self Care | Attending: Obstetrics and Gynecology

## 2020-10-20 ENCOUNTER — Encounter (HOSPITAL_COMMUNITY): Payer: Self-pay

## 2020-10-20 ENCOUNTER — Inpatient Hospital Stay (HOSPITAL_COMMUNITY): Payer: 59 | Admitting: Anesthesiology

## 2020-10-20 ENCOUNTER — Inpatient Hospital Stay (HOSPITAL_COMMUNITY): Payer: 59

## 2020-10-20 ENCOUNTER — Encounter (HOSPITAL_COMMUNITY): Payer: Self-pay | Admitting: Obstetrics & Gynecology

## 2020-10-20 ENCOUNTER — Inpatient Hospital Stay (HOSPITAL_COMMUNITY): Admission: AD | Admit: 2020-10-20 | Payer: 59 | Source: Home / Self Care | Admitting: Obstetrics and Gynecology

## 2020-10-20 DIAGNOSIS — O24420 Gestational diabetes mellitus in childbirth, diet controlled: Secondary | ICD-10-CM

## 2020-10-20 DIAGNOSIS — Z3A37 37 weeks gestation of pregnancy: Secondary | ICD-10-CM

## 2020-10-20 DIAGNOSIS — O1092 Unspecified pre-existing hypertension complicating childbirth: Secondary | ICD-10-CM

## 2020-10-20 DIAGNOSIS — O0990 Supervision of high risk pregnancy, unspecified, unspecified trimester: Secondary | ICD-10-CM

## 2020-10-20 LAB — CBC
HCT: 29.7 % — ABNORMAL LOW (ref 36.0–46.0)
Hemoglobin: 10 g/dL — ABNORMAL LOW (ref 12.0–15.0)
MCH: 28.8 pg (ref 26.0–34.0)
MCHC: 33.7 g/dL (ref 30.0–36.0)
MCV: 85.6 fL (ref 80.0–100.0)
Platelets: 349 10*3/uL (ref 150–400)
RBC: 3.47 MIL/uL — ABNORMAL LOW (ref 3.87–5.11)
RDW: 12.8 % (ref 11.5–15.5)
WBC: 20.7 10*3/uL — ABNORMAL HIGH (ref 4.0–10.5)
nRBC: 0 % (ref 0.0–0.2)

## 2020-10-20 LAB — GLUCOSE, CAPILLARY
Glucose-Capillary: 116 mg/dL — ABNORMAL HIGH (ref 70–99)
Glucose-Capillary: 113 mg/dL — ABNORMAL HIGH (ref 70–99)
Glucose-Capillary: 103 mg/dL — ABNORMAL HIGH (ref 70–99)
Glucose-Capillary: 95 mg/dL (ref 70–99)
Glucose-Capillary: 85 mg/dL (ref 70–99)

## 2020-10-20 LAB — CBC WITH DIFFERENTIAL/PLATELET
Abs Immature Granulocytes: 0.07 10*3/uL (ref 0.00–0.07)
Basophils Absolute: 0 10*3/uL (ref 0.0–0.1)
Basophils Relative: 0 %
Eosinophils Absolute: 0 10*3/uL (ref 0.0–0.5)
Eosinophils Relative: 0 %
HCT: 32.8 % — ABNORMAL LOW (ref 36.0–46.0)
Hemoglobin: 11.3 g/dL — ABNORMAL LOW (ref 12.0–15.0)
Immature Granulocytes: 1 %
Lymphocytes Relative: 16 %
Lymphs Abs: 2.4 10*3/uL (ref 0.7–4.0)
MCH: 28.9 pg (ref 26.0–34.0)
MCHC: 34.5 g/dL (ref 30.0–36.0)
MCV: 83.9 fL (ref 80.0–100.0)
Monocytes Absolute: 0.5 10*3/uL (ref 0.1–1.0)
Monocytes Relative: 4 %
Neutro Abs: 11.8 10*3/uL — ABNORMAL HIGH (ref 1.7–7.7)
Neutrophils Relative %: 79 %
Platelets: 313 10*3/uL (ref 150–400)
RBC: 3.91 MIL/uL (ref 3.87–5.11)
RDW: 12.7 % (ref 11.5–15.5)
WBC: 14.9 10*3/uL — ABNORMAL HIGH (ref 4.0–10.5)
nRBC: 0 % (ref 0.0–0.2)

## 2020-10-20 LAB — RPR: RPR Ser Ql: NONREACTIVE

## 2020-10-20 SURGERY — Surgical Case
Anesthesia: Epidural

## 2020-10-20 MED ORDER — SODIUM CHLORIDE 0.9 % IV SOLN
500.0000 mg | Freq: Once | INTRAVENOUS | Status: AC
  Administered 2020-10-20: 500 mg via INTRAVENOUS

## 2020-10-20 MED ORDER — PRENATAL MULTIVITAMIN CH
1.0000 | ORAL_TABLET | Freq: Every day | ORAL | Status: DC
  Administered 2020-10-21 – 2020-10-23 (×3): 1 via ORAL
  Filled 2020-10-20 (×3): qty 1

## 2020-10-20 MED ORDER — KETOROLAC TROMETHAMINE 30 MG/ML IJ SOLN: 30.0000 mg | Freq: Four times a day (QID) | INTRAMUSCULAR | Status: DC | PRN

## 2020-10-20 MED ORDER — DIPHENHYDRAMINE HCL 50 MG/ML IJ SOLN: 12.5000 mg | INTRAMUSCULAR | Status: DC | PRN

## 2020-10-20 MED ORDER — NALBUPHINE HCL 10 MG/ML IJ SOLN: 5.0000 mg | INTRAMUSCULAR | Status: DC | PRN

## 2020-10-20 MED ORDER — NALBUPHINE HCL 10 MG/ML IJ SOLN: 5.0000 mg | Freq: Once | INTRAMUSCULAR | Status: DC | PRN

## 2020-10-20 MED ORDER — FUROSEMIDE 10 MG/ML IJ SOLN
INTRAMUSCULAR | Status: DC | PRN
  Administered 2020-10-20: 10 mg via INTRAMUSCULAR

## 2020-10-20 MED ORDER — FUROSEMIDE 10 MG/ML IJ SOLN
INTRAMUSCULAR | Status: AC
  Filled 2020-10-20: qty 2

## 2020-10-20 MED ORDER — MORPHINE SULFATE (PF) 0.5 MG/ML IJ SOLN
INTRAMUSCULAR | Status: DC | PRN
  Administered 2020-10-20: 3 ug via EPIDURAL

## 2020-10-20 MED ORDER — NALOXONE HCL 0.4 MG/ML IJ SOLN: 0.4000 mg | INTRAMUSCULAR | Status: DC | PRN

## 2020-10-20 MED ORDER — SCOPOLAMINE 1 MG/3DAYS TD PT72
1.0000 | MEDICATED_PATCH | Freq: Once | TRANSDERMAL | Status: DC
  Administered 2020-10-20: 1.5 mg via TRANSDERMAL

## 2020-10-20 MED ORDER — ACETAMINOPHEN 500 MG PO TABS
1000.0000 mg | ORAL_TABLET | Freq: Four times a day (QID) | ORAL | Status: AC
  Administered 2020-10-20 – 2020-10-21 (×4): 1000 mg via ORAL
  Filled 2020-10-20 (×4): qty 2

## 2020-10-20 MED ORDER — ACETAMINOPHEN 325 MG PO TABS
650.0000 mg | ORAL_TABLET | ORAL | Status: DC | PRN
  Administered 2020-10-22: 650 mg via ORAL
  Filled 2020-10-20: qty 2

## 2020-10-20 MED ORDER — MEPERIDINE HCL 25 MG/ML IJ SOLN: 6.2500 mg | INTRAMUSCULAR | Status: DC | PRN

## 2020-10-20 MED ORDER — CEFAZOLIN SODIUM-DEXTROSE 2-4 GM/100ML-% IV SOLN
INTRAVENOUS | Status: AC
  Filled 2020-10-20: qty 100

## 2020-10-20 MED ORDER — MENTHOL 3 MG MT LOZG: 1.0000 | LOZENGE | OROMUCOSAL | Status: DC | PRN

## 2020-10-20 MED ORDER — SODIUM CHLORIDE 0.9 % IV SOLN
INTRAVENOUS | Status: AC
  Filled 2020-10-20: qty 500

## 2020-10-20 MED ORDER — KETOROLAC TROMETHAMINE 30 MG/ML IJ SOLN
INTRAMUSCULAR | Status: AC
  Filled 2020-10-20: qty 1

## 2020-10-20 MED ORDER — DROPERIDOL 2.5 MG/ML IJ SOLN: 0.6250 mg | Freq: Once | INTRAMUSCULAR | Status: DC | PRN

## 2020-10-20 MED ORDER — DIPHENHYDRAMINE HCL 25 MG PO CAPS: 25.0000 mg | ORAL_CAPSULE | Freq: Four times a day (QID) | ORAL | Status: DC | PRN

## 2020-10-20 MED ORDER — KETOROLAC TROMETHAMINE 30 MG/ML IJ SOLN
30.0000 mg | Freq: Four times a day (QID) | INTRAMUSCULAR | Status: AC
Start: 2020-10-21 — End: 2020-10-21
  Administered 2020-10-21 (×2): 30 mg via INTRAVENOUS
  Filled 2020-10-20 (×2): qty 1

## 2020-10-20 MED ORDER — EPHEDRINE 5 MG/ML INJ: 10.0000 mg | INTRAVENOUS | Status: DC | PRN

## 2020-10-20 MED ORDER — COCONUT OIL OIL: 1.0000 "application " | TOPICAL_OIL | Status: DC | PRN

## 2020-10-20 MED ORDER — NALOXONE HCL 4 MG/10ML IJ SOLN
1.0000 ug/kg/h | INTRAVENOUS | Status: DC | PRN
  Filled 2020-10-20: qty 5

## 2020-10-20 MED ORDER — LIDOCAINE-EPINEPHRINE (PF) 2 %-1:200000 IJ SOLN
INTRAMUSCULAR | Status: DC | PRN
  Administered 2020-10-20: 6 mL via EPIDURAL

## 2020-10-20 MED ORDER — PHENYLEPHRINE 40 MCG/ML (10ML) SYRINGE FOR IV PUSH (FOR BLOOD PRESSURE SUPPORT): 80.0000 ug | PREFILLED_SYRINGE | INTRAVENOUS | Status: DC | PRN

## 2020-10-20 MED ORDER — SODIUM CHLORIDE 0.9% FLUSH: 3.0000 mL | INTRAVENOUS | Status: DC | PRN

## 2020-10-20 MED ORDER — LIDOCAINE HCL (PF) 1 % IJ SOLN
INTRAMUSCULAR | Status: DC | PRN
  Administered 2020-10-20: 10 mL via EPIDURAL

## 2020-10-20 MED ORDER — LACTATED RINGERS IV SOLN: INTRAVENOUS | Status: DC | PRN

## 2020-10-20 MED ORDER — SENNOSIDES-DOCUSATE SODIUM 8.6-50 MG PO TABS
2.0000 | ORAL_TABLET | Freq: Every day | ORAL | Status: DC
  Administered 2020-10-21 – 2020-10-23 (×3): 2 via ORAL
  Filled 2020-10-20 (×3): qty 2

## 2020-10-20 MED ORDER — LACTATED RINGERS IV SOLN: INTRAVENOUS | Status: DC

## 2020-10-20 MED ORDER — NALBUPHINE HCL 10 MG/ML IJ SOLN
5.0000 mg | Freq: Once | INTRAMUSCULAR | Status: DC | PRN
Start: 2020-10-20 — End: 2020-10-23

## 2020-10-20 MED ORDER — IBUPROFEN 600 MG PO TABS
600.0000 mg | ORAL_TABLET | Freq: Four times a day (QID) | ORAL | Status: DC
  Administered 2020-10-21 – 2020-10-23 (×7): 600 mg via ORAL
  Filled 2020-10-20 (×7): qty 1

## 2020-10-20 MED ORDER — OXYTOCIN-SODIUM CHLORIDE 30-0.9 UT/500ML-% IV SOLN: 2.5000 [IU]/h | INTRAVENOUS | Status: AC

## 2020-10-20 MED ORDER — OXYTOCIN-SODIUM CHLORIDE 30-0.9 UT/500ML-% IV SOLN
INTRAVENOUS | Status: DC | PRN
Start: 2020-10-20 — End: 2020-10-20
  Administered 2020-10-20: 30 [IU] via INTRAVENOUS

## 2020-10-20 MED ORDER — OXYCODONE-ACETAMINOPHEN 5-325 MG PO TABS: 2.0000 | ORAL_TABLET | ORAL | Status: DC | PRN

## 2020-10-20 MED ORDER — ONDANSETRON HCL 4 MG/2ML IJ SOLN
INTRAMUSCULAR | Status: AC
  Filled 2020-10-20: qty 2

## 2020-10-20 MED ORDER — MORPHINE SULFATE (PF) 0.5 MG/ML IJ SOLN
INTRAMUSCULAR | Status: AC
  Filled 2020-10-20: qty 10

## 2020-10-20 MED ORDER — FENTANYL-BUPIVACAINE-NACL 0.5-0.125-0.9 MG/250ML-% EP SOLN
12.0000 mL/h | EPIDURAL | Status: DC | PRN
  Administered 2020-10-20: 12 mL/h via EPIDURAL
  Filled 2020-10-20: qty 250

## 2020-10-20 MED ORDER — PROMETHAZINE HCL 25 MG/ML IJ SOLN: 6.2500 mg | INTRAMUSCULAR | Status: DC | PRN

## 2020-10-20 MED ORDER — SIMETHICONE 80 MG PO CHEW
80.0000 mg | CHEWABLE_TABLET | Freq: Three times a day (TID) | ORAL | Status: DC
  Administered 2020-10-21 – 2020-10-23 (×5): 80 mg via ORAL
  Filled 2020-10-20 (×6): qty 1

## 2020-10-20 MED ORDER — ONDANSETRON HCL 4 MG/2ML IJ SOLN
INTRAMUSCULAR | Status: DC | PRN
Start: 2020-10-20 — End: 2020-10-20
  Administered 2020-10-20: 4 mg via INTRAVENOUS

## 2020-10-20 MED ORDER — OXYCODONE HCL 5 MG PO TABS: 5.0000 mg | ORAL_TABLET | Freq: Once | ORAL | Status: DC | PRN

## 2020-10-20 MED ORDER — OXYCODONE HCL 5 MG/5ML PO SOLN: 5.0000 mg | Freq: Once | ORAL | Status: DC | PRN

## 2020-10-20 MED ORDER — CEFAZOLIN SODIUM-DEXTROSE 2-3 GM-%(50ML) IV SOLR
INTRAVENOUS | Status: DC | PRN
  Administered 2020-10-20: 2 g via INTRAVENOUS

## 2020-10-20 MED ORDER — SCOPOLAMINE 1 MG/3DAYS TD PT72
MEDICATED_PATCH | TRANSDERMAL | Status: AC
  Filled 2020-10-20: qty 1

## 2020-10-20 MED ORDER — ONDANSETRON HCL 4 MG/2ML IJ SOLN: 4.0000 mg | Freq: Three times a day (TID) | INTRAMUSCULAR | Status: DC | PRN

## 2020-10-20 MED ORDER — TETANUS-DIPHTH-ACELL PERTUSSIS 5-2.5-18.5 LF-MCG/0.5 IM SUSY: 0.5000 mL | PREFILLED_SYRINGE | Freq: Once | INTRAMUSCULAR | Status: DC

## 2020-10-20 MED ORDER — SIMETHICONE 80 MG PO CHEW: 80.0000 mg | CHEWABLE_TABLET | ORAL | Status: DC | PRN

## 2020-10-20 MED ORDER — KETOROLAC TROMETHAMINE 30 MG/ML IJ SOLN
INTRAMUSCULAR | Status: DC | PRN
  Administered 2020-10-20: 30 mg via INTRAVENOUS

## 2020-10-20 MED ORDER — PHENYLEPHRINE 40 MCG/ML (10ML) SYRINGE FOR IV PUSH (FOR BLOOD PRESSURE SUPPORT)
80.0000 ug | PREFILLED_SYRINGE | INTRAVENOUS | Status: DC | PRN
Start: 2020-10-20 — End: 2020-10-20
  Filled 2020-10-20: qty 10

## 2020-10-20 MED ORDER — LACTATED RINGERS IV SOLN
500.0000 mL | Freq: Once | INTRAVENOUS | Status: AC
  Administered 2020-10-20: 500 mL via INTRAVENOUS

## 2020-10-20 MED ORDER — ENOXAPARIN SODIUM 40 MG/0.4ML IJ SOSY
40.0000 mg | PREFILLED_SYRINGE | INTRAMUSCULAR | Status: DC
  Administered 2020-10-21 – 2020-10-23 (×3): 40 mg via SUBCUTANEOUS
  Filled 2020-10-20 (×3): qty 0.4

## 2020-10-20 MED ORDER — ZOLPIDEM TARTRATE 5 MG PO TABS: 5.0000 mg | ORAL_TABLET | Freq: Every evening | ORAL | Status: DC | PRN

## 2020-10-20 MED ORDER — WITCH HAZEL-GLYCERIN EX PADS: 1.0000 "application " | MEDICATED_PAD | CUTANEOUS | Status: DC | PRN

## 2020-10-20 MED ORDER — DIPHENHYDRAMINE HCL 25 MG PO CAPS: 25.0000 mg | ORAL_CAPSULE | ORAL | Status: DC | PRN

## 2020-10-20 MED ORDER — DIPHENHYDRAMINE HCL 50 MG/ML IJ SOLN
12.5000 mg | INTRAMUSCULAR | Status: DC | PRN
Start: 2020-10-20 — End: 2020-10-20

## 2020-10-20 MED ORDER — DIBUCAINE (PERIANAL) 1 % EX OINT: 1.0000 "application " | TOPICAL_OINTMENT | CUTANEOUS | Status: DC | PRN

## 2020-10-20 MED ORDER — CEFAZOLIN SODIUM-DEXTROSE 2-4 GM/100ML-% IV SOLN: 2.0000 g | Freq: Once | INTRAVENOUS | Status: DC

## 2020-10-20 MED ORDER — LACTATED RINGERS AMNIOINFUSION: INTRAVENOUS | Status: DC

## 2020-10-20 MED ORDER — FENTANYL CITRATE (PF) 100 MCG/2ML IJ SOLN: 25.0000 ug | INTRAMUSCULAR | Status: DC | PRN

## 2020-10-20 SURGICAL SUPPLY — 41 items
BENZOIN TINCTURE PRP APPL 2/3 (GAUZE/BANDAGES/DRESSINGS) ×2 IMPLANT
CHLORAPREP W/TINT 26ML (MISCELLANEOUS) ×2 IMPLANT
CLAMP CORD UMBIL (MISCELLANEOUS) IMPLANT
CLOSURE STERI STRIP 1/2 X4 (GAUZE/BANDAGES/DRESSINGS) ×2 IMPLANT
CLOTH BEACON ORANGE TIMEOUT ST (SAFETY) ×2 IMPLANT
DRAPE C SECTION CLR SCREEN (DRAPES) IMPLANT
DRSG OPSITE POSTOP 4X10 (GAUZE/BANDAGES/DRESSINGS) ×2 IMPLANT
ELECT REM PT RETURN 9FT ADLT (ELECTROSURGICAL) ×2
ELECTRODE REM PT RTRN 9FT ADLT (ELECTROSURGICAL) ×1 IMPLANT
EXTRACTOR VACUUM M CUP 4 TUBE (SUCTIONS) IMPLANT
GAUZE SPONGE 4X4 12PLY STRL LF (GAUZE/BANDAGES/DRESSINGS) ×4 IMPLANT
GLOVE BIO SURGEON STRL SZ7.5 (GLOVE) ×2 IMPLANT
GLOVE BIOGEL PI IND STRL 7.0 (GLOVE) ×1 IMPLANT
GLOVE BIOGEL PI INDICATOR 7.0 (GLOVE) ×1
GOWN STRL REUS W/TWL 2XL LVL3 (GOWN DISPOSABLE) ×2 IMPLANT
GOWN STRL REUS W/TWL LRG LVL3 (GOWN DISPOSABLE) ×4 IMPLANT
KIT ABG SYR 3ML LUER SLIP (SYRINGE) IMPLANT
NEEDLE HYPO 22GX1.5 SAFETY (NEEDLE) ×2 IMPLANT
NEEDLE HYPO 25X5/8 SAFETYGLIDE (NEEDLE) IMPLANT
NS IRRIG 1000ML POUR BTL (IV SOLUTION) ×2 IMPLANT
PACK C SECTION WH (CUSTOM PROCEDURE TRAY) ×2 IMPLANT
PAD ABD 7.5X8 STRL (GAUZE/BANDAGES/DRESSINGS) ×2 IMPLANT
PAD OB MATERNITY 4.3X12.25 (PERSONAL CARE ITEMS) ×2 IMPLANT
PENCIL SMOKE EVAC W/HOLSTER (ELECTROSURGICAL) ×2 IMPLANT
RTRCTR C-SECT PINK 25CM LRG (MISCELLANEOUS) ×2 IMPLANT
STRIP CLOSURE SKIN 1/2X4 (GAUZE/BANDAGES/DRESSINGS) ×2 IMPLANT
SUT CHROMIC 1 CTX 36 (SUTURE) ×6 IMPLANT
SUT PLAIN 0 NONE (SUTURE) IMPLANT
SUT VIC AB 1 CT1 36 (SUTURE) ×2 IMPLANT
SUT VIC AB 2-0 CT1 (SUTURE) ×2 IMPLANT
SUT VIC AB 2-0 CT1 27 (SUTURE) ×1
SUT VIC AB 2-0 CT1 TAPERPNT 27 (SUTURE) ×1 IMPLANT
SUT VIC AB 3-0 CT1 27 (SUTURE) ×2
SUT VIC AB 3-0 CT1 TAPERPNT 27 (SUTURE) ×2 IMPLANT
SUT VIC AB 3-0 SH 27 (SUTURE)
SUT VIC AB 3-0 SH 27X BRD (SUTURE) IMPLANT
SUT VIC AB 4-0 KS 27 (SUTURE) ×2 IMPLANT
SYR BULB IRRIGATION 50ML (SYRINGE) IMPLANT
TOWEL OR 17X24 6PK STRL BLUE (TOWEL DISPOSABLE) ×2 IMPLANT
TRAY FOLEY W/BAG SLVR 14FR LF (SET/KITS/TRAYS/PACK) ×2 IMPLANT
WATER STERILE IRR 1000ML POUR (IV SOLUTION) ×4 IMPLANT

## 2020-10-20 NOTE — Progress Notes (Signed)
Labor Progress Note Lisa Ritter is a 30 y.o. G3P0020 at [redacted]w[redacted]d presented for IOL 2/2 BPP 4/8.   S: Sleeping. Partner watching TV.   O:  BP 127/66   Pulse 70   Temp 98.1 F (36.7 C) (Oral)   Resp 17   LMP 01/17/2020 (Exact Date)  EFM: 135 bpm/15x15/none, every 1-1.5 mins  CVE: Dilation: 1 Effacement (%): 50 Station: -2 Presentation: Vertex Exam by:: E Koster   A&P: 30 y.o. G3P0020 [redacted]w[redacted]d IOL 2/2 BPP 4/8.  #Labor: FB in place. Pit 67mU/min going. Will advance augmentation when FB expelled.  #Pain: Maternally supported #FWB: CAT I #GBS negative  cHTN BP 127/66 most recently -Continue home med: Labetalol 200 mg BID -Continue to monitor  A2GDM CBG 113 most recently.  -Continue metformin 500 mg BID -q4h CBGs  Chris Cripps Autry-Lott, DO 1:20 AM

## 2020-10-20 NOTE — Anesthesia Postprocedure Evaluation (Signed)
Anesthesia Post Note  Patient: Lisa Ritter  Procedure(s) Performed: CESAREAN SECTION     Patient location during evaluation: Mother Baby Anesthesia Type: Epidural Level of consciousness: awake and alert Pain management: pain level controlled Vital Signs Assessment: post-procedure vital signs reviewed and stable Respiratory status: spontaneous breathing, nonlabored ventilation and respiratory function stable Cardiovascular status: stable Postop Assessment: no headache, no backache and epidural receding Anesthetic complications: no   No notable events documented.  Last Vitals:  Vitals:   10/20/20 2050 10/20/20 2100  BP:  112/60  Pulse: 73 79  Resp: 19 17  Temp:    SpO2: 99% 98%    Last Pain:  Vitals:   10/20/20 2050  TempSrc:   PainSc: 0-No pain   Pain Goal:    LLE Motor Response: Purposeful movement (10/20/20 2050) LLE Sensation: Tingling (10/20/20 2050) RLE Motor Response: Purposeful movement (10/20/20 2050) RLE Sensation: Tingling (10/20/20 2050)     Epidural/Spinal Function Cutaneous sensation: Tingles (10/20/20 2050), Patient able to flex knees: Yes (10/20/20 2050), Patient able to lift hips off bed: No (10/20/20 2050), Back pain beyond tenderness at insertion site: No (10/20/20 2050), Progressively worsening motor and/or sensory loss: No (10/20/20 2050), Bowel and/or bladder incontinence post epidural: No (10/20/20 2050)  Mellody Dance

## 2020-10-20 NOTE — Progress Notes (Signed)
Labor Progress Note Lisa Ritter is a 30 y.o. G3P0020 at [redacted]w[redacted]d presented for IOL 2/2 BPP 4/8.   S: Doing well, no concerns at this time.   O:  BP 129/71   Pulse 68   Temp 98.1 F (36.7 C) (Oral)   Resp 17   LMP 01/17/2020 (Exact Date)  EFM: 125 bpm/15x15/no decels, 1-3 min  CVE: Dilation: 4.5 Effacement (%): 70 Station: -1 Presentation: Vertex Exam by:: Melene Muller, RN   A&P: 30 y.o. [name] [redacted]w[redacted]d presented for IOL 2/2 BPP 4/8.  #Labor: Progressing well. S/p FB out. Pitocin titrated up to 60mU/min #Pain: Maternally supported #FWB: Cat I #GBS negative  cHTN BP 133/70 most recently -Continue home med: Labetalol 200 mg BID -Continue to monitor   A2GDM CBG 113 most recently.  -Continue metformin 500 mg BID -q4h CBGs  [name], DO 5:29 AM

## 2020-10-20 NOTE — Anesthesia Procedure Notes (Signed)
Epidural Patient location during procedure: OB Start time: 10/20/2020 1:14 PM End time: 10/20/2020 1:25 PM  Staffing Anesthesiologist: Lucretia Kern, MD Performed: anesthesiologist   Preanesthetic Checklist Completed: patient identified, IV checked, risks and benefits discussed, monitors and equipment checked, pre-op evaluation and timeout performed  Epidural Patient position: sitting Prep: DuraPrep Patient monitoring: heart rate, continuous pulse ox and blood pressure Approach: midline Location: L3-L4 Injection technique: LOR air  Needle:  Needle type: Tuohy  Needle gauge: 17 G Needle length: 9 cm Needle insertion depth: 5.5 cm Catheter type: closed end flexible Catheter size: 19 Gauge Catheter at skin depth: 10.5 cm Test dose: negative  Assessment Events: blood not aspirated, injection not painful, no injection resistance, no paresthesia and negative IV test  Additional Notes Reason for block:procedure for pain

## 2020-10-20 NOTE — Discharge Summary (Addendum)
Postpartum Discharge Summary    Patient Name: Lisa Ritter DOB: 22-May-1990 MRN: [id number]  Date of admission: 10/19/2020 Delivery date:10/20/2020  Delivering provider: Chancy Milroy  Date of discharge: 10/22/2020  Admitting diagnosis: Chronic hypertension affecting pregnancy [O10.919] Intrauterine pregnancy: [redacted]w[redacted]d    Secondary diagnosis:  Active Problems:   Supervision of high risk pregnancy, antepartum   Obesity affecting pregnancy, antepartum   Chronic hypertension affecting pregnancy   Gestational diabetes mellitus   Delivery of pregnancy by cesarean section  Additional problems: None    Discharge diagnosis: Term Pregnancy Delivered, CHTN, and GDM A2                                              Post partum procedures: None Augmentation: AROM, Pitocin, and IP Foley Complications: None  Hospital course: Induction of Labor With Cesarean Section   [name] at 320w1das admitted to the hospital 10/19/2020 for induction of labor. Patient had a labor course significant for variable decelerations and 2 rounds of prolonged decelerations while on pitocin. The patient went for cesarean section due to Non-Reassuring FHR. Delivery details are as follows: Membrane Rupture Time/Date: 2:05 PM ,10/20/2020   Delivery Method:C-Section, Low Transverse  Details of operation can be found in separate operative Note.  Patient had an uncomplicated postpartum course. She is ambulating, tolerating a regular diet, passing flatus, and urinating well.  Patient is discharged home in stable condition on 10/22/20.      Newborn Data: Birth date:10/20/2020  Birth time:7:24 PM  Gender:Female  Living status:Living  Apgars:8 ,9  Weight:2098 g                                Magnesium Sulfate received: No BMZ received: No Rhophylac:N/A MMR:No T-DaP: Not given Flu: No Transfusion:No  Physical exam  Vitals:   10/21/20 0828 10/21/20 1244 10/21/20 2209 10/22/20 0535  BP: (!) 109/46 (!)  123/57 127/65 120/67  Pulse: 63 75 84 87  Resp: _0 Temp: 98.3 F (36.8 C) 98.2 F (36.8 C) 98.8 F (37.1 C) 98 F (36.7 C)  TempSrc: Oral Oral Oral Oral  SpO2: 97% 99% 100% 100%   General: alert, cooperative, and no distress Lochia: appropriate Uterine Fundus: firm Incision: Healing well with no significant drainage, Dressing is clean, dry, and intact DVT Evaluation: No evidence of DVT seen on physical exam. Labs: Lab Results  Component Value Date   WBC 16.6 (H) 10/21/2020   HGB 9.0 (L) 10/21/2020   HCT 26.0 (L) 10/21/2020   MCV 84.7 10/21/2020   PLT 310 10/21/2020   CMP Latest Ref Rng & Units 10/19/2020  Glucose 70 - 99 mg/dL 117(H)  BUN 6 - 20 mg/dL 8  Creatinine 0.44 - 1.00 mg/dL 0.72  Sodium 135 - 145 mmol/L 133(L)  Potassium 3.5 - 5.1 mmol/L 3.7  Chloride 98 - 111 mmol/L 100  CO2 22 - 32 mmol/L 21(L)  Calcium 8.9 - 10.3 mg/dL 9.0  Total Protein 6.5 - 8.1 g/dL 6.5  Total Bilirubin 0.3 - 1.2 mg/dL 0.4  Alkaline Phos 38 - 126 U/L 170(H)  AST 15 - 41 U/L 30  ALT 0 - 44 U/L 38   Edinburgh Score: Edinburgh Postnatal Depression Scale Screening Tool 10/22/2020  I have been able to  laugh and see the funny side of things. 0  I have looked forward with enjoyment to things. 0  I have blamed myself unnecessarily when things went wrong. 1  I have been anxious or worried for no good reason. 1  I have felt scared or panicky for no good reason. 1  Things have been getting on top of me. 1  I have been so unhappy that I have had difficulty sleeping. 0  I have felt sad or miserable. 0  I have been so unhappy that I have been crying. 0  The thought of harming myself has occurred to me. 0  Edinburgh Postnatal Depression Scale Total 4     After visit meds:  Allergies as of 10/22/2020       Reactions   Penicillins Other (See Comments)   Unsure of reactions        Medication List     STOP taking these medications    Accu-Chek Guide test strip Generic  drug: glucose blood   Accu-Chek Guide w/Device Kit   Accu-Chek Softclix Lancets lancets   aspirin 81 MG chewable tablet   labetalol 200 MG tablet Commonly known as: NORMODYNE   metFORMIN 500 MG tablet Commonly known as: GLUCOPHAGE       TAKE these medications    acetaminophen 325 MG tablet Commonly known as: TYLENOL Take 2 tablets (650 mg total) by mouth every 4 (four) hours as needed for mild pain (temperature > 101.5.).   ibuprofen 600 MG tablet Commonly known as: ADVIL Take 1 tablet (600 mg total) by mouth every 6 (six) hours as needed.   multivitamin-prenatal 27-0.8 MG Tabs tablet Take 1 tablet by mouth daily at 12 noon.         Discharge home in stable condition Infant Feeding: Breast Infant Disposition:home with mother Discharge instruction: per After Visit Summary and Postpartum booklet. Activity: Advance as tolerated. Pelvic rest for 6 weeks.  Diet: routine diet Future Appointments: Future Appointments  Date Time Provider [place]  10/27/2020 10:35 AM Constant, Vickii Chafe, MD [place] None  11/30/2020  9:15 AM [facility] None  11/30/2020 10:15 AM Constant, Vickii Chafe, MD [facility] None   Follow up Visit: Message sent to [facility] by Dr. Cy Blamer on 10/13 Please schedule this patient for a In person postpartum visit in 4 weeks with the following provider: MD. Additional Postpartum F/U:Incision check 1 week  High risk pregnancy complicated by: GDM and HTN Delivery mode:  C-Section, Low Transverse  Anticipated Birth Control:   Declines  10/22/2020 Wells Guiles, DO  GME ATTESTATION:  I saw and evaluated the patient. I agree with the findings and the plan of care as documented in the resident's note.  Darrelyn Hillock, DO OB Fellow, [place] for [place] 10/22/2020 8:53 PM

## 2020-10-20 NOTE — Progress Notes (Signed)
Labor Progress Note Alexie Lanni is a 30 y.o. G3P0020 at [redacted]w[redacted]d presented for IOL for BPP 4/8 S: Doing well. Feeling contractions more  O:  BP 114/64   Pulse 66   Temp 98.1 F (36.7 C) (Oral)   Resp 15   LMP 01/17/2020 (Exact Date)  EFM: 140/good variability/+accels/no decels  CVE: Dilation: 5 Effacement (%): 80 Station: -2 Presentation: Vertex Exam by:: Dr. Laure Kidney   A&P: 30 y.o. [id number] [redacted]w[redacted]d IOL for BPP 4/8 #Labor: Progressing gradually.Swept membranes this check. Increase pitocin to 12mL/hr #Pain: more discomfort now. Still controlled without pain medication #FWB: cat 1 #GBS negative #cHTN: home labetalol. BP well-controlled. Most recent 114/64. #GDMA2: home metformin 500mg  bid. Glucose well controlled. checks q 4 hrs  , PGY-1, Faculty Service 9:55 AM

## 2020-10-20 NOTE — Progress Notes (Signed)
Patient Vitals for the past 4 hrs:  BP Temp Temp src Pulse  10/20/20 1311 -- 97.6 F (36.4 C) Axillary --  10/20/20 1101 116/65 -- -- 62   Comfortable w/epidural.  Blood sugar 95/103. Some mild variable decels, ctx q 1-2 minutes. AROM w/clear fluid, IUPC placed.  Variables worsened, now after the ctx.  No umbilical cord palpable. + scalp stim. Cx 5/80/-1. IVF bolus, position changes, pit decreased then stopped.  Dr. Donavan Foil called to room.  Terbutaline given, FSE applied. FHR now 140's, moderate variability, decels resolved.  Will restart pitocin in an hour if needed.

## 2020-10-20 NOTE — Transfer of Care (Signed)
Immediate Anesthesia Transfer of Care Note  Patient: Lisa Ritter  Procedure(s) Performed: CESAREAN SECTION  Patient Location: PACU  Anesthesia Type:Epidural  Level of Consciousness: awake  Airway & Oxygen Therapy: Patient Spontanous Breathing  Post-op Assessment: Report given to RN and Post -op Vital signs reviewed and stable  Post vital signs: Reviewed and stable  Last Vitals:  Vitals Value Taken Time  BP 104/65 10/20/20 2019  Temp    Pulse 80 10/20/20 2022  Resp 20 10/20/20 2022  SpO2 97 % 10/20/20 2022  Vitals shown include unvalidated device data.  Last Pain:  Vitals:   10/20/20 1700  TempSrc:   PainSc: 0-No pain         Complications: No notable events documented.

## 2020-10-20 NOTE — Anesthesia Preprocedure Evaluation (Signed)
Anesthesia Evaluation  Patient identified by MRN, date of birth, ID band Patient awake    Reviewed: Allergy & Precautions, H&P , NPO status , Patient's Chart, lab work & pertinent test results  History of Anesthesia Complications Negative for: history of anesthetic complications  Airway Mallampati: II  TM Distance: >3 FB     Dental   Pulmonary neg pulmonary ROS,    Pulmonary exam normal        Cardiovascular hypertension, On Home Beta Blockers  Rhythm:regular Rate:Normal     Neuro/Psych negative neurological ROS  negative psych ROS   GI/Hepatic negative GI ROS, Neg liver ROS,   Endo/Other  diabetes, Gestational, Oral Hypoglycemic Agents  Renal/GU negative Renal ROS  negative genitourinary   Musculoskeletal   Abdominal   Peds  Hematology negative hematology ROS (+)   Anesthesia Other Findings   Reproductive/Obstetrics (+) Pregnancy                             Anesthesia Physical Anesthesia Plan  ASA: 2  Anesthesia Plan: Epidural   Post-op Pain Management:    Induction:   PONV Risk Score and Plan:   Airway Management Planned:   Additional Equipment:   Intra-op Plan:   Post-operative Plan:   Informed Consent: I have reviewed the patients History and Physical, chart, labs and discussed the procedure including the risks, benefits and alternatives for the proposed anesthesia with the patient or authorized representative who has indicated his/her understanding and acceptance.       Plan Discussed with:   Anesthesia Plan Comments:         Anesthesia Quick Evaluation

## 2020-10-20 NOTE — Op Note (Addendum)
Cesarean Section Procedure Note  10/20/2020  8:34 PM  PATIENT:  Lisa Ritter  30 y.o. female  PRE-OPERATIVE DIAGNOSIS:  Primary cesarean section; fetal intolerance to labor  POST-OPERATIVE DIAGNOSIS:  Primary cesarean section; fetal intolerance to labor  PROCEDURE:  Procedure(s): CESAREAN SECTION (N/A)  SURGEON:  Surgeon(s) and Role:    * Hermina Staggers, MD - Primary    * Warner Mccreedy, MD - Assisting  ANESTHESIA:   epidural  EBL: 556cc  Total I/O In: 1200 [I.V.:900; IV Piggyback:300] Out: 855 [Urine:200; Blood:655]  BLOOD ADMINISTERED:none  DRAINS: none   LOCAL MEDICATIONS USED:  NONE  SPECIMEN:  No Specimen  DISPOSITION OF SPECIMEN:  N/A   Procedure Details  The patient was seen in the Holding Room. The risks, benefits, complications, treatment options, and expected outcomes were discussed with the patient.  The patient concurred with the proposed plan, giving informed consent.  The site of surgery properly noted/marked. The patient was taken to Operating Room, identified as Lisa Ritter and the procedure verified as C-Section Delivery. A Time Out was held and the above information confirmed.  After induction of anesthesia, the patient was draped and prepped in the usual sterile manner. A Pfannenstiel incision was made and carried down through the subcutaneous tissue to the fascia. Fascial incision was made and extended transversely. The fascia was separated from the underlying rectus tissue superiorly and inferiorly. The peritoneum was identified and entered. Peritoneal incision was extended longitudinally. A low transverse uterine incision was made. Delivered from cephalic presentation was a Female with Apgar scores of 8 at one minute and 9 at five minutes with weight pending. After the umbilical cord was clamped and cut cord blood was obtained for evaluation. The placenta was removed intact and appeared normal. The uterine outline, tubes and ovaries appeared normal. The  uterine incision was closed with running locked sutures of 0 Monocryl and an imbricating second layer closure was done with the same suture type. Hemostasis was observed. Lavage was carried out until clear. Peritoneum and muscle were then re-approximated with 2-0 vicryl. The fascia was then reapproximated with running sutures of 0 Monocryl. The subcutaneous layer was approximated with plain gut.The skin was reapproximated with 4-0 Vicryl.  Instrument, sponge, and needle counts were correct prior the abdominal closure and at the conclusion of the case.   Complications:  None; patient tolerated the procedure well.  COUNTS:  YES   PATIENT DISPOSITION:  PACU - hemodynamically stable.   Delay start of Pharmacological VTE agent (>24hrs) due to surgical blood loss or risk of bleeding: no            Condition: stable  Warner Mccreedy, MD, MPH OB Fellow, Faculty Practice

## 2020-10-20 NOTE — Progress Notes (Signed)
Patient ID: Lisa Ritter, female   DOB: Apr 20, 1990, 30 y.o.   MRN: [id number] Pt has made little cervical change in over 12 hrs. Fetal intolerance of labor when pitocin restarted and increased. C section recommended to pt.  The risks of cesarean section discussed with the patient included but were not limited to: bleeding which may require transfusion or reoperation; infection which may require antibiotics; injury to bowel, bladder, ureters or other surrounding organs; injury to the fetus; need for additional procedures including hysterectomy in the event of a life-threatening hemorrhage; placental abnormalities wth subsequent pregnancies, incisional problems, thromboembolic phenomenon and other postoperative/anesthesia complications. The patient concurred with the proposed plan, giving informed written consent for the procedure.   Anesthesia and OR aware. Preoperative prophylactic antibiotics and SCDs ordered on call to the OR.  To OR when ready.  Celine Dishman L. Alysia Penna, MD

## 2020-10-21 ENCOUNTER — Encounter (HOSPITAL_COMMUNITY): Payer: Self-pay | Admitting: Obstetrics and Gynecology

## 2020-10-21 LAB — CBC
HCT: 26 % — ABNORMAL LOW (ref 36.0–46.0)
Hemoglobin: 9 g/dL — ABNORMAL LOW (ref 12.0–15.0)
MCH: 29.3 pg (ref 26.0–34.0)
MCHC: 34.6 g/dL (ref 30.0–36.0)
MCV: 84.7 fL (ref 80.0–100.0)
Platelets: 310 10*3/uL (ref 150–400)
RBC: 3.07 MIL/uL — ABNORMAL LOW (ref 3.87–5.11)
RDW: 12.7 % (ref 11.5–15.5)
WBC: 16.6 10*3/uL — ABNORMAL HIGH (ref 4.0–10.5)
nRBC: 0 % (ref 0.0–0.2)

## 2020-10-21 NOTE — Lactation Note (Addendum)
This note was copied from a baby's chart. Lactation Consultation Note  Patient Name: Lisa Ritter [id number] Date: 10/21/2020 Reason for consult: Follow-up assessment;Early term 37-38.6wks;Infant < 6lbs;Primapara Age:31 hours  Mom pumped for (her first) 15-min session w/size 21 flanges. Droplets were noted on the flanges, which were offered to infant on a gloved finger.  Dad was shown how to do side-lying inclined for bottle feeding. Infant did very well with an occasional need for pacing.   Education was done on formula storage and how to clean pump parts. Parents have no additional questions at this time. Mom will continue to pump when infant receives a bottle.  [name] [facility] 10/21/2020, 2:47 PM

## 2020-10-21 NOTE — Lactation Note (Signed)
This note was copied from a baby's chart. Lactation Consultation Note  Patient Name: Lisa Ritter [id number] Date: 10/21/2020   Age:30 hours  Mom reports having been leaking since 7 months gestation. I provided size 21 flanges for the Medela DEBP here. They are a good fit at this time. Mom will contact me when pumping cycle has stopped.   Mom was made aware of O/P services, breastfeeding support groups, and our phone # for post-discharge questions.   [name] [facility] 10/21/2020, 1:59 PM

## 2020-10-21 NOTE — Progress Notes (Signed)
Post-Op Day 1, primary CS for NRFHT  Subjective: No complaints, up ad lib, voiding and tolerating PO, passing flatus,small lochia,.plans to breastfeed, condoms  Objective: Blood pressure (!) 114/58, pulse 78, temperature 98.4 F (36.9 C), temperature source Oral, resp. rate 18, last menstrual period 01/17/2020, SpO2 99 %, unknown if currently breastfeeding.  Physical Exam:  General: alert, cooperative and no distress Lochia:normal flow Chest: CTAB Heart: RRR no m/r/g Abdomen: +BS, soft, nontender, dsg needs changing Uterine Fundus: firm DVT Evaluation: No evidence of DVT seen on physical exam. Extremities: trace edema  Recent Labs    10/20/20 2040 10/21/20 0420  HGB 10.0* 9.0*  HCT 29.7* 26.0*    Assessment/Plan: Breastfeeding, Lactation consult, and Circumcision prior to discharge if peds OKs (baby 4#10 oz)   LOS: 2 days   Jacklyn Shell 10/21/2020, 7:25 AM

## 2020-10-21 NOTE — Lactation Note (Signed)
This note was copied from a baby's chart. Lactation Consultation Note  Patient Name: Lisa Ritter [id number] Date: 10/21/2020   Age:31 hours  "[name]" was cueing as I entered the room. I assisted with latch, but he fell asleep almost immediately. Mom's breasts feel fuller than expected for a primip. Some areolar edema noted. I observed infant bottle feeding with the slow-flow nipple with success. I demonstrated paced bottle-feeding techniques.    RN had set Mom up with a pump. She will likely need smaller flanges. Mom will call for me to return to assess pumping, etc. once she has eaten breakfast. Mom knows how to reach me.   [name] [facility] 10/21/2020, 7:40 AM

## 2020-10-22 MED ORDER — IBUPROFEN 600 MG PO TABS: 600.0000 mg | ORAL_TABLET | Freq: Four times a day (QID) | ORAL | 0 refills | Status: DC | PRN

## 2020-10-22 MED ORDER — NIFEDIPINE ER OSMOTIC RELEASE 30 MG PO TB24
30.0000 mg | ORAL_TABLET | Freq: Every day | ORAL | Status: DC
  Administered 2020-10-22 – 2020-10-23 (×2): 30 mg via ORAL
  Filled 2020-10-22 (×2): qty 1

## 2020-10-22 MED ORDER — OXYCODONE-ACETAMINOPHEN 5-325 MG PO TABS
1.0000 | ORAL_TABLET | ORAL | Status: DC | PRN
  Administered 2020-10-22 – 2020-10-23 (×3): 1 via ORAL
  Filled 2020-10-22 (×3): qty 1

## 2020-10-22 MED ORDER — ACETAMINOPHEN 325 MG PO TABS: 650.0000 mg | ORAL_TABLET | ORAL | 0 refills | Status: DC | PRN

## 2020-10-22 NOTE — Lactation Note (Addendum)
This note was copied from a baby's chart. Lactation Consultation Note  Patient Name: Lisa Ritter Date: 10/22/2020 Reason for consult: Follow-up assessment;Mother's request;Early term 37-38.6wks;Infant < 6lbs;Breastfeeding assistance;Primapara;1st time breastfeeding Age:31 hours  Mom states not able to latch infant to breast since yesterday. Infant s/p circ, sleepy. LC did some suck training to extend tongue and flange out lips. Infant latched but did not initiate a suck. Mom to offer volume with bottle and then try latching with steps we reviewed.  LPTI guidelines reviewed to reduce calorie loss including keeping total feeding under 30 min.   LC reviewed with Mom importance to post pump to maintain milk supply. Mom also pre pump for 5-10 min to extend nipples before latching.   Plan 1. To feed based on cues 8-12x 24 hr period.  2. Mom to offer breasts in prone position with breast compression, noting signs of milk transfer.  3. Mom to supplement with DEBP q 3 hrs for offer EBM first followed by formula with slow flow nipple and pace bottle feeding. Mom to supplement 10-20 ml after latching. Mom to offer more if infant not latching at breast.  4. Mom to pump DEBP as stated every 3 hrs for 15 min   Maternal Data    Feeding Mother's Current Feeding Choice: Breast Milk and Formula  LATCH Score                    Lactation Tools Discussed/Used Tools: Flanges Flange Size: 21 Breast pump type: Double-Electric Breast Pump Pump Education: Setup, frequency, and cleaning;Milk Storage Reason for Pumping: increase stimulation Pumping frequency: every 3 hrs for 15 min  Interventions Interventions: Breast feeding basics reviewed;Support pillows;Education;Pace feeding;Position options;Assisted with latch;Skin to skin;Expressed milk;Breast massage;Hand express;Shells;Infant Driven Feeding Algorithm education;Pre-pump if needed;Breast compression;DEBP;Adjust  position  Discharge    Consult Status Consult Status: Follow-up Date: 10/23/20 Follow-up type: In-patient    Lisa Ritter  Nicholson-Springer 10/22/2020, 12:35 PM

## 2020-10-23 MED ORDER — OXYCODONE HCL 5 MG PO TABS: 5.0000 mg | ORAL_TABLET | Freq: Four times a day (QID) | ORAL | 0 refills | Status: DC | PRN

## 2020-10-23 MED ORDER — MEDROXYPROGESTERONE ACETATE 150 MG/ML IM SUSP
150.0000 mg | Freq: Once | INTRAMUSCULAR | Status: AC
  Administered 2020-10-23: 150 mg via INTRAMUSCULAR
  Filled 2020-10-23: qty 1

## 2020-10-23 MED ORDER — NIFEDIPINE ER 30 MG PO TB24: 30.0000 mg | ORAL_TABLET | Freq: Every day | ORAL | 1 refills | Status: DC

## 2020-10-23 MED ORDER — POLYETHYLENE GLYCOL 3350 17 GM/SCOOP PO POWD: 17.0000 g | Freq: Every day | ORAL | 2 refills | Status: DC | PRN

## 2020-10-23 MED ORDER — FERROUS SULFATE 325 (65 FE) MG PO TABS: 325.0000 mg | ORAL_TABLET | ORAL | 0 refills | Status: DC

## 2020-10-23 NOTE — Progress Notes (Signed)
Notified Dr. Annia Friendly of pt's BPs this evening, 137/66 @ 2053 medicated for pain 6/10. BP rechecked @ 2154 141/73 with decreased pain score. History of HTN in pregnancy that was treated with labetalol but not prescribed any BP medications at this time. Dr. Annia Friendly to place order. Will continue to monitor.

## 2020-10-23 NOTE — Lactation Note (Signed)
This note was copied from a baby's chart. Lactation Consultation Note  Patient Name: Lisa Ritter [id number] Date: 10/23/2020 Reason for consult: Follow-up assessment;Primapara;1st time breastfeeding;Early term 37-38.6wks;Infant < 6lbs Age:31 hours  LC in to room for follow up. Parents are expecting discharge today pending pediatrician recommendation. "[name]" is skin to skin upon arrival. Mother reports "[name]" took ~5 mL of EBM and ~5 mL of formula around 0800.  Reviewed pace bottle-feed, upright position with pauses and frequent burping, in addition to volume guidelines for formula feeding. Talked about slow flow vs extra slow slow nipples to help with feedings.  Discussed local resources to support her breastfeeding goals. Reviewed normal behavior and patterns, voids and stools as signs good intake, pumping, clusterfeeding, skin to skin. Talked about milk coming into volume and managing engorgement. Mother is pumping every 3 hours and denies discomfort/pain.  Plan: 1-Feeding on demand or 8-12 times in 24h period. 2-Pump or hand-express and offer first EBM. 3-Encouraged maternal rest, hydration and food intake.   Contact LC as needed for feeds/support/concerns/questions. All questions answered at this time. Reviewed LC brochure.    Feeding Mother's Current Feeding Choice: Breast Milk and Formula Nipple Type: Slow - flow  LATCH Score Latch: Repeated attempts needed to sustain latch, nipple held in mouth throughout feeding, stimulation needed to elicit sucking reflex.  Audible Swallowing: None  Type of Nipple: Everted at rest and after stimulation  Comfort (Breast/Nipple): Soft / non-tender  Hold (Positioning): No assistance needed to correctly position infant at breast.  LATCH Score: 7   Lactation Tools Discussed/Used Tools: Pump;Flanges Flange Size: 21 Breast pump type: Double-Electric Breast Pump;Manual Pump Education: Milk Storage Reason for Pumping: Stimulation and  Supplementation Pumping frequency: Q3 Pumped volume: 5 mL  Interventions Interventions: Breast feeding basics reviewed;Skin to skin;Ice;DEBP;Hand pump;Expressed milk;Education;Pace feeding;LC Services brochure;Breast massage  Discharge Discharge Education: Engorgement and breast care;Warning signs for feeding baby Pump: DEBP;Manual;Personal  Consult Status Consult Status: Complete Date: 10/23/20 Follow-up type: Call as needed    Tandi Hanko A Higuera Ancidey 10/23/2020, 9:08 AM

## 2020-10-23 NOTE — Discharge Summary (Signed)
Postpartum Discharge Summary    Patient Name: Lisa Ritter DOB: 10-06-1990 MRN: [id number]  Date of admission: 10/19/2020 Delivery date:10/20/2020  Delivering provider: Chancy Milroy  Date of discharge: 10/23/2020  Admitting diagnosis: Chronic hypertension affecting pregnancy [O10.919] Intrauterine pregnancy: [redacted]w[redacted]d    Secondary diagnosis:  Active Problems:   Supervision of high risk pregnancy, antepartum   Obesity affecting pregnancy, antepartum   Chronic hypertension affecting pregnancy   Gestational diabetes mellitus   Delivery of pregnancy by cesarean section  Additional problems: None    Discharge diagnosis: Term Pregnancy Delivered, CHTN, and GDM A2                                              Post partum procedures: None Augmentation: AROM, Pitocin, and IP Foley Complications: None  Hospital course: Induction of Labor With Cesarean Section   [name] at [id number] admitted to the hospital 10/19/2020 for induction of labor. Patient had a labor course significant for variable decelerations and 2 rounds of prolonged decelerations while on pitocin. The patient went for cesarean section due to Non-Reassuring FHR. Delivery details are as follows: Membrane Rupture Time/Date: 2:05 PM ,10/20/2020   Delivery Method:C-Section, Low Transverse  Details of operation can be found in separate operative Note.  Patient had an uncomplicated postpartum course. She is ambulating, tolerating a regular diet, passing flatus, and urinating well.  Patient is discharged home in stable condition on 10/23/20.      Newborn Data: Birth date:10/20/2020  Birth time:7:24 PM  Gender:Female  Living status:Living  Apgars:8 ,9  Weight:2098 g                                Magnesium Sulfate received: No BMZ received: No Rhophylac:N/A MMR:No T-DaP: Not given Flu: No Transfusion:No  Physical exam  Vitals:   10/22/20 1630 10/22/20 2053 10/22/20 2154 10/23/20 0649  BP: 126/72 137/66 (!)  141/73 129/72  Pulse:  88 83 83  Resp: 17 20  20   Temp: 98.4 F (36.9 C) 98.2 F (36.8 C)  98.7 F (37.1 C)  TempSrc:  Oral  Oral  SpO2: 100% 100%  99%   General: alert, cooperative, and no distress Lochia: appropriate Uterine Fundus: firm Incision: Healing well with no significant drainage, Dressing is clean, dry, and intact DVT Evaluation: No evidence of DVT seen on physical exam. Labs: Lab Results  Component Value Date   WBC 16.6 (H) 10/21/2020   HGB 9.0 (L) 10/21/2020   HCT 26.0 (L) 10/21/2020   MCV 84.7 10/21/2020   PLT 310 10/21/2020   CMP Latest Ref Rng & Units 10/19/2020  Glucose 70 - 99 mg/dL 117(H)  BUN 6 - 20 mg/dL 8  Creatinine 0.44 - 1.00 mg/dL 0.72  Sodium 135 - 145 mmol/L 133(L)  Potassium 3.5 - 5.1 mmol/L 3.7  Chloride 98 - 111 mmol/L 100  CO2 22 - 32 mmol/L 21(L)  Calcium 8.9 - 10.3 mg/dL 9.0  Total Protein 6.5 - 8.1 g/dL 6.5  Total Bilirubin 0.3 - 1.2 mg/dL 0.4  Alkaline Phos 38 - 126 U/L 170(H)  AST 15 - 41 U/L 30  ALT 0 - 44 U/L 38   Edinburgh Score: Edinburgh Postnatal Depression Scale Screening Tool 10/22/2020  I have been able to laugh and see the  funny side of things. 0  I have looked forward with enjoyment to things. 0  I have blamed myself unnecessarily when things went wrong. 1  I have been anxious or worried for no good reason. 1  I have felt scared or panicky for no good reason. 1  Things have been getting on top of me. 1  I have been so unhappy that I have had difficulty sleeping. 0  I have felt sad or miserable. 0  I have been so unhappy that I have been crying. 0  The thought of harming myself has occurred to me. 0  Edinburgh Postnatal Depression Scale Total 4     After visit meds:  Allergies as of 10/23/2020       Reactions   Penicillins Other (See Comments)   Unsure of reactions        Medication List     STOP taking these medications    Accu-Chek Guide test strip Generic drug: glucose blood   Accu-Chek Guide  w/Device Kit   Accu-Chek Softclix Lancets lancets   aspirin 81 MG chewable tablet   labetalol 200 MG tablet Commonly known as: NORMODYNE   metFORMIN 500 MG tablet Commonly known as: GLUCOPHAGE       TAKE these medications    acetaminophen 325 MG tablet Commonly known as: TYLENOL Take 2 tablets (650 mg total) by mouth every 4 (four) hours as needed for mild pain (temperature > 101.5.).   ferrous sulfate 325 (65 FE) MG tablet Take 1 tablet (325 mg total) by mouth every other day.   ibuprofen 600 MG tablet Commonly known as: ADVIL Take 1 tablet (600 mg total) by mouth every 6 (six) hours as needed.   multivitamin-prenatal 27-0.8 MG Tabs tablet Take 1 tablet by mouth daily at 12 noon.   NIFEdipine 30 MG 24 hr tablet Commonly known as: ADALAT CC Take 1 tablet (30 mg total) by mouth daily.   oxyCODONE 5 MG immediate release tablet Commonly known as: Oxy IR/ROXICODONE Take 1-2 tablets (5-10 mg total) by mouth every 6 (six) hours as needed for breakthrough pain.   polyethylene glycol powder 17 GM/SCOOP powder Commonly known as: GLYCOLAX/MIRALAX Take 17 g by mouth daily as needed for moderate constipation or mild constipation.        Discharge home in stable condition Infant Feeding: Breast Infant Disposition:home with mother Discharge instruction: per After Visit Summary and Postpartum booklet. Activity: Advance as tolerated. Pelvic rest for 6 weeks.  Diet: routine diet Future Appointments: Future Appointments  Date Time Provider [place]  10/27/2020 10:35 AM Constant, Vickii Chafe, MD [place] None  11/30/2020  9:15 AM [facility] None  11/30/2020 10:15 AM Constant, Vickii Chafe, MD [facility] None   Follow up Visit: Message sent to [facility] by Dr. Cy Blamer on 10/13 Please schedule this patient for a In person postpartum visit in 4 weeks with the following provider: MD. Additional Postpartum F/U:Incision check 1 week  High risk pregnancy complicated by: GDM and  HTN Delivery mode:  C-Section, Low Transverse  Anticipated Birth Control:   Declines  10/23/2020 Manya Silvas, CNM

## 2020-10-24 LAB — SURGICAL PATHOLOGY

## 2020-10-27 ENCOUNTER — Encounter: Payer: Self-pay | Admitting: Obstetrics and Gynecology

## 2020-10-27 ENCOUNTER — Other Ambulatory Visit: Payer: Self-pay

## 2020-10-27 ENCOUNTER — Ambulatory Visit (INDEPENDENT_AMBULATORY_CARE_PROVIDER_SITE_OTHER): Payer: 59 | Admitting: Obstetrics and Gynecology

## 2020-10-27 VITALS — BP 122/73 | HR 90 | Wt 191.0 lb

## 2020-10-27 DIAGNOSIS — Z4889 Encounter for other specified surgical aftercare: Secondary | ICD-10-CM

## 2020-10-27 MED ORDER — HYDROCHLOROTHIAZIDE 25 MG PO TABS: 25.0000 mg | ORAL_TABLET | Freq: Every day | ORAL | 3 refills | Status: DC

## 2020-10-27 NOTE — Progress Notes (Signed)
30 yo here for BP and incision check. Patient had a cesarean section a week ago. She reports doing well and is receiving ample help from her husband and parents. She is breastfeeding with issues. Incisional pain is well controlled. Patient reports onset of hives every morning following the intake of nifedipine.  Blood pressure 122/73, pulse 90, weight 191 lb (86.6 kg), last menstrual period 01/17/2020, unknown if currently breastfeeding. GENERAL: Well-developed, well-nourished female in no acute distress.  ABDOMEN: Soft, nontender, nondistended. No organomegaly. Incision: steristrips in place. Incision healing well without erythema, induration or drainage.  EXTREMITIES: No cyanosis, clubbing, or edema, 2+ distal pulses.  A/P 30 yo here for incision check - Incision healing well - Wound care instructions provided - Discontinue procardia. Rx HCTZ provided - RTC in 1 week for BP check and in 4 weeks for pp visit

## 2020-10-27 NOTE — Progress Notes (Signed)
Blood pressure 122/73, pulse 90, weight 191 lb (86.6 kg), last menstrual period 01/17/2020, unknown if currently breastfeeding.;

## 2020-10-27 NOTE — Progress Notes (Signed)
Pt is 1 week post c/s, here today for incision check.  Pt states she took BP med this morning and has since has redness and hives on arms and face.  EPDS score 0 today.

## 2020-11-01 ENCOUNTER — Telehealth (HOSPITAL_COMMUNITY): Payer: Self-pay

## 2020-11-01 NOTE — Telephone Encounter (Signed)
No answer. Left message to return nurse call.  Marcelino Duster [facility] 11/01/2020,1637

## 2020-11-03 ENCOUNTER — Ambulatory Visit: Payer: 59 | Admitting: *Deleted

## 2020-11-03 ENCOUNTER — Other Ambulatory Visit: Payer: Self-pay

## 2020-11-03 VITALS — BP 145/80 | HR 67

## 2020-11-03 DIAGNOSIS — O10919 Unspecified pre-existing hypertension complicating pregnancy, unspecified trimester: Secondary | ICD-10-CM

## 2020-11-03 NOTE — Progress Notes (Signed)
Subjective:  Lisa Ritter is a 30 y.o. female here for BP check. Was seen 10/27/20 for incision check and BP. Reported reaction to BP Procardia that visit. Previous note indicated Procardia was to be DC'd and HCTZ started. Patient reports she was told to keep taking Procardia and come for recheck in 1 week. Did not take Procardia today.  Hypertension ROS: taking medications as instructed, medication side effects include: rash and feeling hot, no TIA's, no chest pain on exertion, no dyspnea on exertion, and no swelling of ankles.    Objective:  LMP 01/17/2020 (Exact Date)   Appearance alert, well appearing, and in no distress and oriented to person, place, and time. General exam BP noted to be well controlled today in office.    Assessment:   Blood Pressure borderline controlled.   Plan:  Orders and follow up as documented in patient record. Per Dr. Jolayne Panther patient is to discontinue Procardia and start HCTZ. RX HCTZ was sent 10/27/20. Patient advised. Verbalized understanding. Patient to return for BP check in 1 week.

## 2020-11-10 ENCOUNTER — Ambulatory Visit: Payer: 59

## 2020-11-11 ENCOUNTER — Other Ambulatory Visit: Payer: Self-pay

## 2020-11-11 ENCOUNTER — Ambulatory Visit (INDEPENDENT_AMBULATORY_CARE_PROVIDER_SITE_OTHER): Payer: 59

## 2020-11-11 VITALS — BP 134/79 | HR 80

## 2020-11-11 DIAGNOSIS — Z0130 Encounter for examination of blood pressure without abnormal findings: Secondary | ICD-10-CM

## 2020-11-11 NOTE — Progress Notes (Signed)
.  [facility] °

## 2020-11-11 NOTE — Progress Notes (Signed)
Subjective:  Lisa Ritter is a 30 y.o. female here for BP check s/p c/s on 10/20/20. Pt took HCTZ 25 mg at 0900.  Hypertension ROS: taking medications as instructed, no medication side effects noted, no TIA's, no chest pain on exertion, no dyspnea on exertion, and no swelling of ankles.    Objective:  BP 134/79   Pulse 80   LMP 01/17/2020 (Exact Date)   Appearance alert, well appearing, and in no distress. General exam BP noted to be well controlled today in office.    Assessment:   Blood Pressure stable.   Plan:  Current treatment plan is effective, no change in therapy.

## 2020-11-30 ENCOUNTER — Other Ambulatory Visit: Payer: Self-pay

## 2020-11-30 ENCOUNTER — Ambulatory Visit (INDEPENDENT_AMBULATORY_CARE_PROVIDER_SITE_OTHER): Payer: 59 | Admitting: Obstetrics and Gynecology

## 2020-11-30 ENCOUNTER — Encounter: Payer: Self-pay | Admitting: Obstetrics and Gynecology

## 2020-11-30 ENCOUNTER — Other Ambulatory Visit: Payer: 59

## 2020-11-30 DIAGNOSIS — O24435 Gestational diabetes mellitus in puerperium, controlled by oral hypoglycemic drugs: Secondary | ICD-10-CM

## 2020-11-30 DIAGNOSIS — O24415 Gestational diabetes mellitus in pregnancy, controlled by oral hypoglycemic drugs: Secondary | ICD-10-CM

## 2020-11-30 MED ORDER — MEDROXYPROGESTERONE ACETATE 150 MG/ML IM SUSP: 150.0000 mg | INTRAMUSCULAR | 5 refills | Status: DC

## 2020-11-30 NOTE — Progress Notes (Signed)
Post Partum Visit Note  Lisa Ritter is a 30 y.o. G27P1021 female who presents for a postpartum visit. She is 6 weeks postpartum following a primary cesarean section.  I have fully reviewed the prenatal and intrapartum course. The delivery was at [redacted]w[redacted]d gestational weeks.  Anesthesia: epidural. Postpartum course has been uncomplicated. Baby is doing well. Baby is feeding by both breast and bottle - Similac Advance. Bleeding no bleeding. Bowel function is normal. Bladder function is normal. Patient is not sexually active. Contraception method is Depo-Provera injections. Postpartum depression screening: negative.   The pregnancy intention screening data noted above was reviewed. Potential methods of contraception were discussed. The patient elected to proceed with No data recorded.   Edinburgh Postnatal Depression Scale - 11/30/20 0955       Edinburgh Postnatal Depression Scale:  In the Past 7 Days   I have been able to laugh and see the funny side of things. 0    I have looked forward with enjoyment to things. 0    I have blamed myself unnecessarily when things went wrong. 0    I have been anxious or worried for no good reason. 0    I have felt scared or panicky for no good reason. 0    Things have been getting on top of me. 0    I have been so unhappy that I have had difficulty sleeping. 0    I have felt sad or miserable. 0    I have been so unhappy that I have been crying. 0    The thought of harming myself has occurred to me. 0    Edinburgh Postnatal Depression Scale Total 0             Health Maintenance Due  Topic Date Due   COVID-19 Vaccine (1) Never done   Pneumococcal Vaccine 78-27 Years old (1 - PCV) Never done   URINE MICROALBUMIN  Never done   TETANUS/TDAP  Never done   INFLUENZA VACCINE  Never done       Review of Systems Pertinent items noted in HPI and remainder of comprehensive ROS otherwise negative.  Objective:  BP 128/81   Pulse 72   Ht 5\' 2"  (1.575  m)   Wt 186 lb 4.8 oz (84.5 kg)   LMP 01/17/2020 (Exact Date)   BMI 34.07 kg/m    General:  alert, cooperative, and no distress   Breasts:  normal  Lungs: clear to auscultation bilaterally  Heart:  regular rate and rhythm  Abdomen: soft, non-tender; bowel sounds normal; no masses,  no organomegaly   Wound well approximated incision  GU exam:  not indicated       Assessment:    There are no diagnoses linked to this encounter.  Normal postpartum exam.   Plan:   Essential components of care per ACOG recommendations:  1.  Mood and well being: Patient with negative depression screening today. Reviewed local resources for support.  - Patient tobacco use? No.   - hx of drug use? No.    2. Infant care and feeding:  -Patient currently breastmilk feeding? Yes. Discussed returning to work and pumping. Reviewed importance of draining breast regularly to support lactation.  -Social determinants of health (SDOH) reviewed in EPIC. No concerns  3. Sexuality, contraception and birth spacing - Patient does not want a pregnancy in the next year.    - Reviewed forms of contraception in tiered fashion. Patient desired Depo-Provera today.   - Discussed  birth spacing of 18 months  4. Sleep and fatigue -Encouraged family/partner/community support of 4 hrs of uninterrupted sleep to help with mood and fatigue  5. Physical Recovery  - Discussed patients delivery and complications. She describes her labor as good. - Patient had a C-section, no problems at delivery.  Patient expressed understanding - Patient has urinary incontinence? No. - Patient is safe to resume physical and sexual activity  6.  Health Maintenance - HM due items addressed Yes - Last pap smear 11/2019 Pap smear not done at today's visit.  -Breast Cancer screening indicated? No.   7. Chronic Disease/Pregnancy Condition follow up: Hypertension and GDM - PCP follow up on CHTN - Glucola today to assess DM status  Catalina Antigua, MD [facility] [employer], [facility]

## 2020-12-01 LAB — GLUCOSE TOLERANCE, 2 HOURS
Glucose, 2 hour: 150 mg/dL — ABNORMAL HIGH (ref 70–139)
Glucose, GTT - Fasting: 89 mg/dL (ref 70–99)

## 2020-12-06 ENCOUNTER — Other Ambulatory Visit: Payer: Self-pay

## 2020-12-06 DIAGNOSIS — E119 Type 2 diabetes mellitus without complications: Secondary | ICD-10-CM

## 2021-01-16 ENCOUNTER — Ambulatory Visit: Payer: 59

## 2021-01-18 ENCOUNTER — Ambulatory Visit (INDEPENDENT_AMBULATORY_CARE_PROVIDER_SITE_OTHER): Payer: 59

## 2021-01-18 ENCOUNTER — Other Ambulatory Visit: Payer: Self-pay

## 2021-01-18 VITALS — BP 144/88 | HR 77 | Ht 62.0 in | Wt 193.0 lb

## 2021-01-18 DIAGNOSIS — Z3042 Encounter for surveillance of injectable contraceptive: Secondary | ICD-10-CM

## 2021-01-18 MED ORDER — MEDROXYPROGESTERONE ACETATE 150 MG/ML IM SUSP
150.0000 mg | Freq: Once | INTRAMUSCULAR | Status: AC
  Administered 2021-01-18: 150 mg via INTRAMUSCULAR

## 2021-01-18 NOTE — Progress Notes (Signed)
SUBJECTIVE: Lisa Ritter is a 31 y.o. female who  Presents for DEPO Injection.  OBJECTIVE: Appears well, in no apparent distress.  Vital signs are normal.   ASSESSMENT: Need for BC, on time for DEPO.  PLAN:  DEPO injection given in RUOQ, tolerated well.  Next DEPO due 04/05/21-04/19/21  Administrations This Visit     medroxyPROGESTERone (DEPO-PROVERA) injection 150 mg     Admin Date 01/18/2021 Action Given Dose 150 mg Route Intramuscular Administered By Tamela Oddi, RMA

## 2021-01-18 NOTE — Progress Notes (Signed)
Patient was assessed and managed by nursing staff during this encounter. I have reviewed the chart and agree with the documentation and plan. I have also made any necessary editorial changes.  Warden Fillers, MD 01/18/2021 3:23 PM

## 2021-04-10 ENCOUNTER — Ambulatory Visit: Payer: 59

## 2022-08-14 ENCOUNTER — Ambulatory Visit (INDEPENDENT_AMBULATORY_CARE_PROVIDER_SITE_OTHER): Payer: 59

## 2022-08-14 VITALS — BP 130/79 | HR 76 | Ht 62.0 in | Wt 188.4 lb

## 2022-08-14 DIAGNOSIS — Z3201 Encounter for pregnancy test, result positive: Secondary | ICD-10-CM | POA: Diagnosis not present

## 2022-08-14 LAB — POCT URINE PREGNANCY: Preg Test, Ur: POSITIVE — AB

## 2022-08-14 NOTE — Progress Notes (Signed)
Lisa Ritter here for a UPT. Pt had a positive upt at home. LMP is 07/09/2022.     UPT in office Positive.    Reviewed medications and instructed pt to continue taking prenatal vitamin.  Pt to follow up for New OB intake at [facility] on 09/13/2022 at 10:15am.

## 2022-09-13 ENCOUNTER — Other Ambulatory Visit (INDEPENDENT_AMBULATORY_CARE_PROVIDER_SITE_OTHER): Payer: 59

## 2022-09-13 ENCOUNTER — Ambulatory Visit: Payer: 59 | Admitting: *Deleted

## 2022-09-13 VITALS — BP 122/79 | HR 83 | Wt 187.9 lb

## 2022-09-13 DIAGNOSIS — Z3A09 9 weeks gestation of pregnancy: Secondary | ICD-10-CM

## 2022-09-13 DIAGNOSIS — O0990 Supervision of high risk pregnancy, unspecified, unspecified trimester: Secondary | ICD-10-CM | POA: Insufficient documentation

## 2022-09-13 DIAGNOSIS — O3680X0 Pregnancy with inconclusive fetal viability, not applicable or unspecified: Secondary | ICD-10-CM

## 2022-09-13 DIAGNOSIS — O0991 Supervision of high risk pregnancy, unspecified, first trimester: Secondary | ICD-10-CM

## 2022-09-13 NOTE — Patient Instructions (Signed)

## 2022-09-13 NOTE — Progress Notes (Signed)
New OB Intake  I connected with Lisa Ritter  on 09/13/22 at 10:15 AM EDT by In Person Visit and verified that I am speaking with the correct person using two identifiers. Nurse is located at [facility] and pt is located at [place].  I discussed the limitations, risks, security and privacy concerns of performing an evaluation and management service by telephone and the availability of in person appointments. I also discussed with the patient that there may be a patient responsible charge related to this service. The patient expressed understanding and agreed to proceed.  I explained I am completing New OB Intake today. We discussed EDD of Not found.. Pt is [id number]. I reviewed her allergies, medications and Medical/Surgical/OB history.    Patient Active Problem List   Diagnosis Date Noted   Delivery of pregnancy by cesarean section 10/20/2020   Gestational diabetes mellitus 08/24/2020   Chronic hypertension affecting pregnancy 06/02/2020   Obesity affecting pregnancy, antepartum 04/07/2020   History of miscarriage, currently pregnant 04/07/2020    Concerns addressed today  Delivery Plans Plans to deliver at [facility] Discussed the nature of our practice with multiple providers including residents and students. Due to the size of the practice, the delivering provider may not be the same as those providing prenatal care.   Patient  is not a candidate for  water birth. Offered upcoming OB visit with CNM to discuss further.  MyChart/Babyscripts MyChart access verified. I explained pt will have some visits in office and some virtually. Babyscripts instructions given and order placed. Patient verifies receipt of registration text/e-mail. Account successfully created and app downloaded.  Blood Pressure Cuff/Weight Scale Patient has BP cuff from last pregnancy. Explained after first prenatal appt pt will check weekly and document in Babyscripts. Patient does not have weight scale; patient may  purchase if they desire to track weight weekly in Babyscripts.  Anatomy US Explained first scheduled [place] will be around 19 weeks. Anatomy US scheduled for TBD at TBD.  Interested in [facility]? If yes, send referral and doula dot phrase.   Is patient a candidate for Babyscripts Optimization? No - High Risk  First visit review I reviewed new OB appt with patient. Explained pt will be seen by Albertine Grates, NP at first visit. Discussed Avelina Laine genetic screening with patient. Requests Panorama and Horizon.. Routine prenatal labs  OB Panel, OB Urine, CMP, Hgb A1C collected at today's visit.    Last Pap No results found for: "DIAGPAP"  Harrel Lemon, RN 09/13/2022  10:15 AM

## 2022-09-14 LAB — CBC/D/PLT+RPR+RH+ABO+RUBIGG...
Antibody Screen: NEGATIVE
Basophils Absolute: 0 10*3/uL (ref 0.0–0.2)
Basos: 0 %
EOS (ABSOLUTE): 0.1 10*3/uL (ref 0.0–0.4)
Eos: 1 %
HCV Ab: NONREACTIVE
HIV Screen 4th Generation wRfx: NONREACTIVE
Hematocrit: 36.6 % (ref 34.0–46.6)
Hemoglobin: 12.5 g/dL (ref 11.1–15.9)
Hepatitis B Surface Ag: NEGATIVE
Immature Grans (Abs): 0 10*3/uL (ref 0.0–0.1)
Immature Granulocytes: 0 %
Lymphocytes Absolute: 3.1 10*3/uL (ref 0.7–3.1)
Lymphs: 31 %
MCH: 29.2 pg (ref 26.6–33.0)
MCHC: 34.2 g/dL (ref 31.5–35.7)
MCV: 86 fL (ref 79–97)
Monocytes Absolute: 0.5 10*3/uL (ref 0.1–0.9)
Monocytes: 5 %
Neutrophils Absolute: 6.3 10*3/uL (ref 1.4–7.0)
Neutrophils: 63 %
Platelets: 361 10*3/uL (ref 150–450)
RBC: 4.28 x10E6/uL (ref 3.77–5.28)
RDW: 12.3 % (ref 11.7–15.4)
RPR Ser Ql: NONREACTIVE
Rh Factor: POSITIVE
Rubella Antibodies, IGG: 2.37 {index} (ref >0.99)
WBC: 10 10*3/uL (ref 3.4–10.8)

## 2022-09-14 LAB — COMPREHENSIVE METABOLIC PANEL
ALT: 24 IU/L (ref 0–32)
AST: 18 IU/L (ref 0–40)
Albumin: 4.3 g/dL (ref 3.9–4.9)
Alkaline Phosphatase: 107 IU/L (ref 44–121)
BUN/Creatinine Ratio: 13 (ref 9–23)
BUN: 7 mg/dL (ref 6–20)
Bilirubin Total: 0.3 mg/dL (ref 0.0–1.2)
CO2: 21 mmol/L (ref 20–29)
Calcium: 9.5 mg/dL (ref 8.7–10.2)
Chloride: 101 mmol/L (ref 96–106)
Creatinine, Ser: 0.55 mg/dL — ABNORMAL LOW (ref 0.57–1.00)
Globulin, Total: 2.7 g/dL (ref 1.5–4.5)
Glucose: 81 mg/dL (ref 70–99)
Potassium: 4.2 mmol/L (ref 3.5–5.2)
Sodium: 136 mmol/L (ref 134–144)
Total Protein: 7 g/dL (ref 6.0–8.5)
eGFR: 124 mL/min/{1.73_m2} (ref >59)

## 2022-09-14 LAB — HCV INTERPRETATION

## 2022-09-14 LAB — HEMOGLOBIN A1C
Est. average glucose Bld gHb Est-mCnc: 111 mg/dL
Hgb A1c MFr Bld: 5.5 % (ref 4.8–5.6)

## 2022-09-15 LAB — URINE CULTURE, OB REFLEX

## 2022-09-15 LAB — CULTURE, OB URINE

## 2022-09-20 ENCOUNTER — Other Ambulatory Visit: Payer: Self-pay | Admitting: *Deleted

## 2022-09-20 DIAGNOSIS — O0990 Supervision of high risk pregnancy, unspecified, unspecified trimester: Secondary | ICD-10-CM

## 2022-09-20 DIAGNOSIS — O24419 Gestational diabetes mellitus in pregnancy, unspecified control: Secondary | ICD-10-CM

## 2022-09-20 MED ORDER — ACCU-CHEK SOFTCLIX LANCETS MISC: 1.0000 | Freq: Four times a day (QID) | 12 refills | Status: AC

## 2022-09-20 MED ORDER — ACCU-CHEK GUIDE VI STRP
ORAL_STRIP | 12 refills | Status: AC
Start: 2022-09-20 — End: ?

## 2022-09-20 NOTE — Progress Notes (Signed)
Pt requesting additional RX glucose testing supplies. RX sent.

## 2022-09-27 ENCOUNTER — Other Ambulatory Visit (HOSPITAL_COMMUNITY)
Admission: RE | Admit: 2022-09-27 | Discharge: 2022-09-27 | Disposition: A | Discharge status: Home / Self Care | Payer: 59 | Source: Ambulatory Visit | Attending: Obstetrics and Gynecology | Admitting: Obstetrics and Gynecology

## 2022-09-27 ENCOUNTER — Ambulatory Visit (INDEPENDENT_AMBULATORY_CARE_PROVIDER_SITE_OTHER): Payer: 59 | Admitting: Obstetrics and Gynecology

## 2022-09-27 VITALS — BP 118/72 | HR 81 | Wt 185.8 lb

## 2022-09-27 DIAGNOSIS — O0990 Supervision of high risk pregnancy, unspecified, unspecified trimester: Secondary | ICD-10-CM

## 2022-09-27 DIAGNOSIS — O10911 Unspecified pre-existing hypertension complicating pregnancy, first trimester: Secondary | ICD-10-CM

## 2022-09-27 DIAGNOSIS — Z124 Encounter for screening for malignant neoplasm of cervix: Secondary | ICD-10-CM

## 2022-09-27 DIAGNOSIS — O24415 Gestational diabetes mellitus in pregnancy, controlled by oral hypoglycemic drugs: Secondary | ICD-10-CM | POA: Insufficient documentation

## 2022-09-27 DIAGNOSIS — Z3480 Encounter for supervision of other normal pregnancy, unspecified trimester: Secondary | ICD-10-CM | POA: Diagnosis present

## 2022-09-27 DIAGNOSIS — O10919 Unspecified pre-existing hypertension complicating pregnancy, unspecified trimester: Secondary | ICD-10-CM

## 2022-09-27 DIAGNOSIS — Z98891 History of uterine scar from previous surgery: Secondary | ICD-10-CM

## 2022-09-27 DIAGNOSIS — O24311 Unspecified pre-existing diabetes mellitus in pregnancy, first trimester: Secondary | ICD-10-CM | POA: Diagnosis not present

## 2022-09-27 DIAGNOSIS — Z3A11 11 weeks gestation of pregnancy: Secondary | ICD-10-CM

## 2022-09-27 NOTE — Patient Instructions (Signed)
Start Aspirin 81mg  daily  at 12 weeks

## 2022-09-27 NOTE — Progress Notes (Signed)
INITIAL PRENATAL VISIT  Subjective:   Lisa Ritter is being seen today for her first obstetrical visit.    She is at [redacted]w[redacted]d gestation by LMP Her obstetrical history is significant for  chtn, gestational diabetes, previous csection . Relationship with FOB: significant other, living together. Patient does intend to breast feed. Pregnancy history fully reviewed.  Patient reports no complaints.  Indications for ASA therapy (per uptodate) One of the following: Previous pregnancy with preeclampsia, especially early onset and with an adverse outcome No Multifetal gestation No Chronic hypertension Yes Type 1 or 2 diabetes mellitus Yes Chronic kidney disease No Autoimmune disease (antiphospholipid syndrome, systemic lupus erythematosus) No  Objective:    Obstetric History OB History  Gravida Para Term Preterm AB Living  4 1 1  0 2 1  SAB IAB Ectopic Multiple Live Births  2 0 0 0 1    # Outcome Date GA Lbr Len/2nd Weight Sex Type Anes PTL Lv  4 Current           3 Term 10/20/20 [redacted]w[redacted]d  4 lb 10 oz (2.098 kg) M CS-LTranv EPI  LIV  2 SAB 2018          1 SAB 2015            Past Medical History:  Diagnosis Date   Diabetes mellitus without complication (HCC)    GDM   Hypertension    Pregnancy    Past Surgical History:  Procedure Laterality Date   CESAREAN SECTION N/A 10/20/2020   Procedure: CESAREAN SECTION;  Surgeon: Hermina Staggers, MD;  Location: [facility] LD ORS;  Service: Obstetrics;  Laterality: N/A;    Current Outpatient Medications on File Prior to Visit  Medication Sig Dispense Refill   Accu-Chek Softclix Lancets lancets 1 each by Other route 4 (four) times daily. 100 each 12   Cholecalciferol (VITAMIN D3) 50 MCG (2000 UT) TABS Take by mouth.     folic acid (FOLVITE) 400 MCG tablet Take 400 mcg by mouth daily.     glucose blood (ACCU-CHEK GUIDE) test strip Use to check blood sugars four times a day was instructed 100 each 12   polyethylene glycol powder (GLYCOLAX/MIRALAX) 17  GM/SCOOP powder Take 17 g by mouth daily as needed for moderate constipation or mild constipation. 500 g 2   Prenatal Vit-Fe Fumarate-FA (MULTIVITAMIN-PRENATAL) 27-0.8 MG TABS tablet Take 1 tablet by mouth daily at 12 noon.     acetaminophen (TYLENOL) 325 MG tablet Take 2 tablets (650 mg total) by mouth every 4 (four) hours as needed for mild pain (temperature > 101.5.). 30 tablet 0   ferrous sulfate 325 (65 FE) MG tablet Take 1 tablet (325 mg total) by mouth every other day. 30 tablet 0   hydrochlorothiazide (HYDRODIURIL) 25 MG tablet Take 1 tablet (25 mg total) by mouth daily. 30 tablet 3   ibuprofen (ADVIL) 600 MG tablet Take 1 tablet (600 mg total) by mouth every 6 (six) hours as needed. 30 tablet 0   medroxyPROGESTERone (DEPO-PROVERA) 150 MG/ML injection Inject 1 mL (150 mg total) into the muscle every 3 (three) months. 1 mL 5   NIFEdipine (ADALAT CC) 30 MG 24 hr tablet Take 1 tablet (30 mg total) by mouth daily. 30 tablet 1   Omega-3 Fatty Acids (FISH OIL) 300 MG CAPS Take by mouth.     oxyCODONE (OXY IR/ROXICODONE) 5 MG immediate release tablet Take 1-2 tablets (5-10 mg total) by mouth every 6 (six) hours as needed for breakthrough pain.  30 tablet 0   No current facility-administered medications on file prior to visit.    Allergies  Allergen Reactions   Penicillins Other (See Comments)    Unsure of reactions    Social History:  reports that she has never smoked. She has never used smokeless tobacco. She reports that she does not drink alcohol and does not use drugs.  Family History  Problem Relation Age of Onset   Cancer Father    Diabetes Father     The following portions of the patient's history were reviewed and updated as appropriate: allergies, current medications, past family history, past medical history, past social history, past surgical history and problem list.  Review of Systems Review of Systems  All other systems reviewed and are negative.     Physical Exam:   BP 118/72   Pulse 81   Wt 185 lb 12.8 oz (84.3 kg)   LMP 07/09/2022 (Exact Date)   BMI 33.98 kg/m  CONSTITUTIONAL: Well-developed, well-nourished female in no acute distress.  HENT:  Normocephalic, atraumatic.  Oropharynx is clear and moist EYES: Conjunctivae normal.  NECK: Normal range of motion SKIN: Skin is warm and dry MUSCULOSKELETAL: Normal range of motion NEUROLOGIC: Alert and oriented to person, place, and time. Normal muscle tone coordination.  PSYCHIATRIC: Normal mood and affect. Normal behavior. Normal judgment and thought content. CARDIOVASCULAR: Normal heart rate noted, regular rhythm RESPIRATORY: normal effort ABDOMEN: Soft PELVIC: Normal appearing external genitalia; normal appearing vaginal mucosa and cervix.  No abnormal discharge noted.  Pap smear obtained.   Fetal Heart Rate (bpm): 166   Movement: Absent       Assessment:    Pregnancy: G4P1021   1. Preexisting diabetes complicating pregnancy in first trimester, antepartum Failed pp gtt, starting checking sugars, majority w/i range.  A1c at NOB intake 5.5  2. History of C-section 2022 c/s for FHR and arrest dilation Desires TOLAC  3. Chronic hypertension affecting pregnancy Was on meds last pregnancy, normotensive today no meds Discussed checking at home, precautions discussed when to notify for elevated bps - Protein / creatinine ratio, urine  4. Supervision of other normal pregnancy, antepartum BP and FHR normal Discussed recommendation of ASA during pregnancy, will start at 12 weeks Initial labs drawn and reviewed Prenatal vitamins. Problem list reviewed and updated. Reviewed in detail the nature of the practice with collaborative care between  Genetic screening discussed: NIPS/First trimester screen/Quad/AFP ordered. Role of ultrasound in pregnancy discussed; Anatomy US: ordered. - Cervicovaginal ancillary only( [place]) - PANORAMA PRENATAL TEST - Protein / creatinine ratio, urine  5.  Cervical cancer screening  - Cytology - PAP( [place])    Follow up in 4 weeks. Discussed clinic routines, schedule of care and testing, genetic screening options, involvement of students and residents under the direct supervision of APPs and doctors and presence of female providers. Pt verbalized understanding.  Return in 4 weeks for routine prenatal   Future Appointments  Date Time Provider Department Center  10/24/2022  3:30 PM Gerrit Heck, CNM [facility] None  11/19/2022  9:15 AM [facility] NURSE [facility]  11/19/2022  9:30 AM [facility] US1 [facility]    Sue Lush, FNP

## 2022-09-28 LAB — CERVICOVAGINAL ANCILLARY ONLY
Bacterial Vaginitis (gardnerella): NEGATIVE
Candida Glabrata: NEGATIVE
Candida Vaginitis: NEGATIVE
Chlamydia: NEGATIVE
Comment: NEGATIVE
Comment: NEGATIVE
Comment: NEGATIVE
Comment: NEGATIVE
Comment: NEGATIVE
Comment: NORMAL
Neisseria Gonorrhea: NEGATIVE
Trichomonas: NEGATIVE

## 2022-09-28 LAB — PROTEIN / CREATININE RATIO, URINE
Creatinine, Urine: 161.5 mg/dL
Protein, Ur: 20.7 mg/dL
Protein/Creat Ratio: 128 mg/g creat (ref 0–200)

## 2022-10-03 ENCOUNTER — Other Ambulatory Visit: Payer: Self-pay

## 2022-10-03 DIAGNOSIS — Z3480 Encounter for supervision of other normal pregnancy, unspecified trimester: Secondary | ICD-10-CM

## 2022-10-03 LAB — PANORAMA PRENATAL TEST FULL PANEL:PANORAMA TEST PLUS 5 ADDITIONAL MICRODELETIONS: FETAL FRACTION: 8.1

## 2022-10-05 LAB — CYTOLOGY - PAP
Comment: NEGATIVE
Diagnosis: NEGATIVE
High risk HPV: NEGATIVE

## 2022-10-17 LAB — HORIZON CUSTOM: REPORT SUMMARY: NEGATIVE

## 2022-10-17 IMAGING — US US MFM OB DETAIL+14 WK
1 series · 13 of 28 positions shown · non-contrast
Comparison: none

[Series 1: us mfm ob detail+14 wk · 117 acquisitions, 13 frames shown]
[im 5/117]
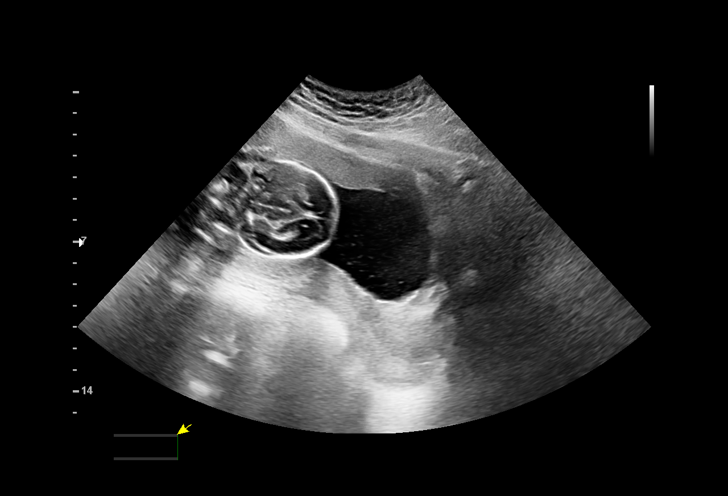
[im 13/117]
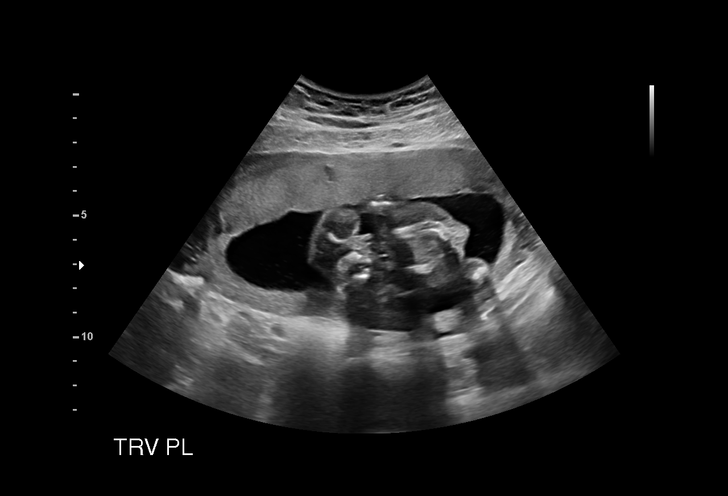
[im 22/117]
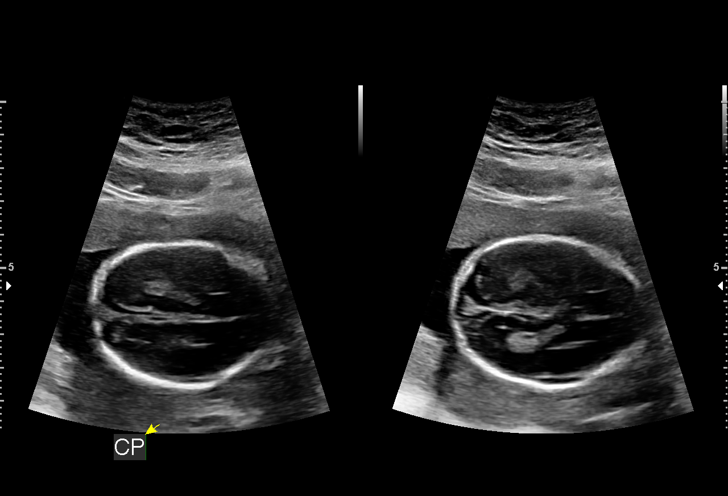
[im 31/117]
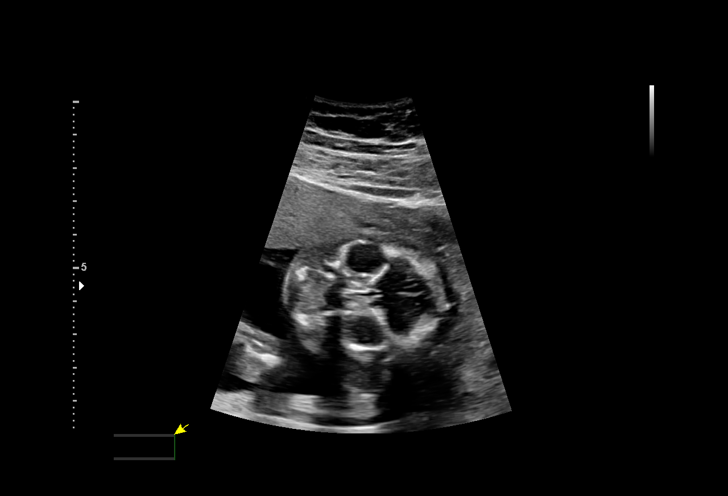
[im 39/117]
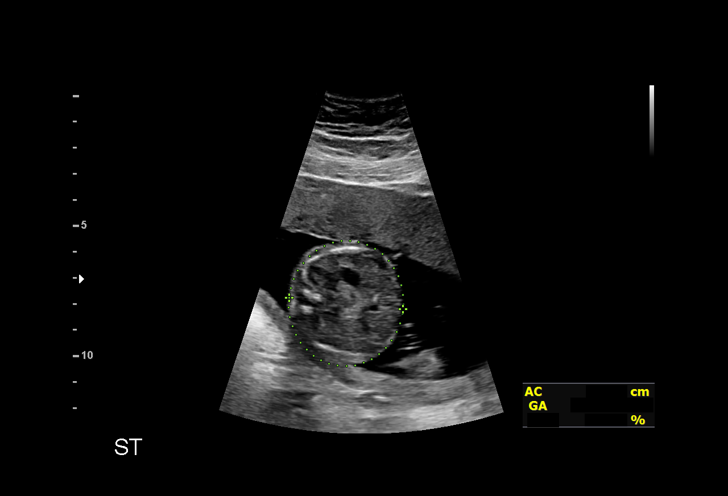
[im 48/117]
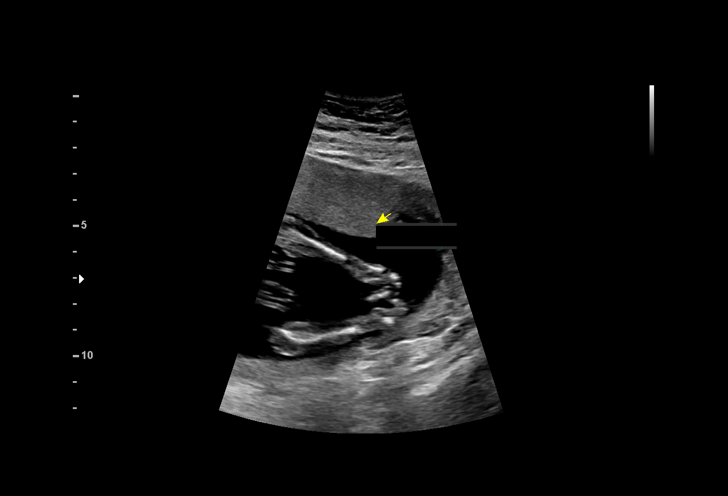
[im 61/117]
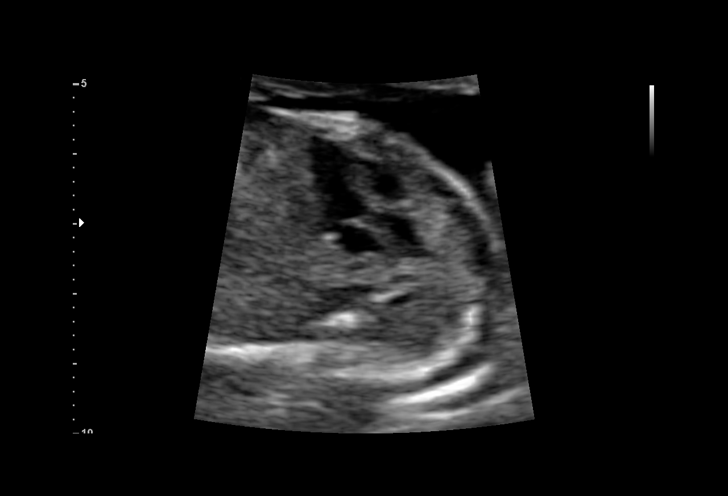
[im 69/117]
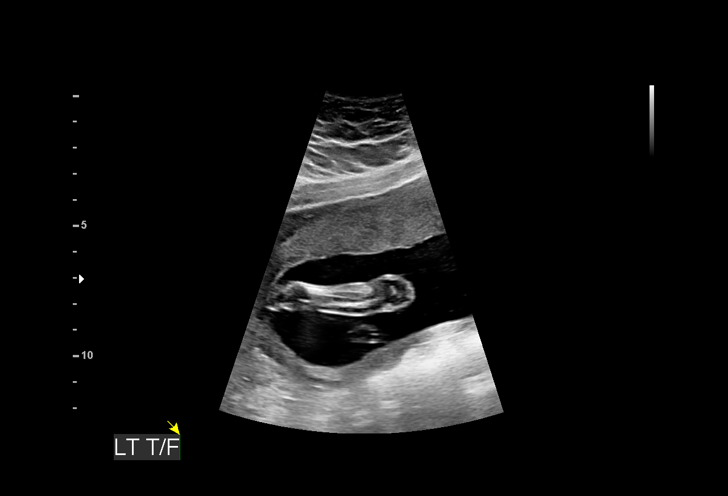
[im 78/117]
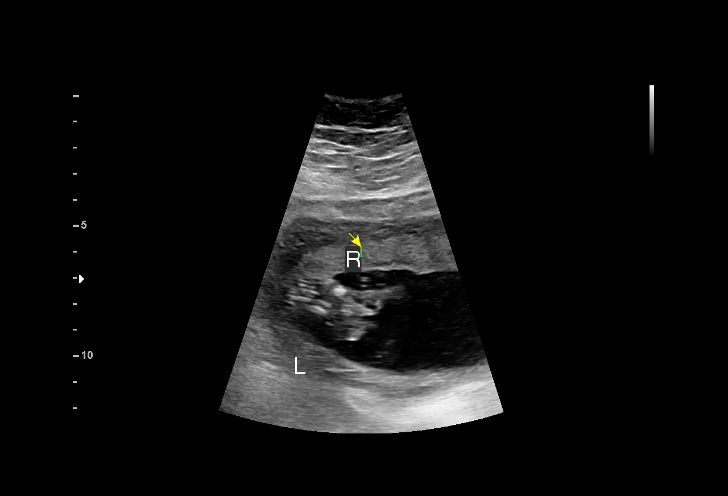
[im 86/117]
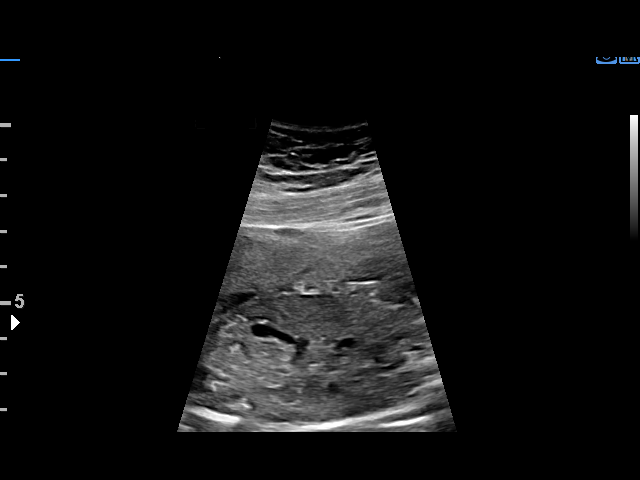
[im 95/117]
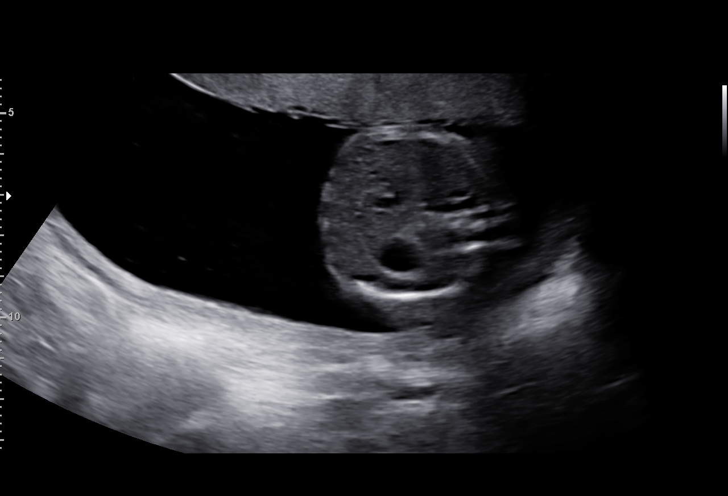
[im 104/117]
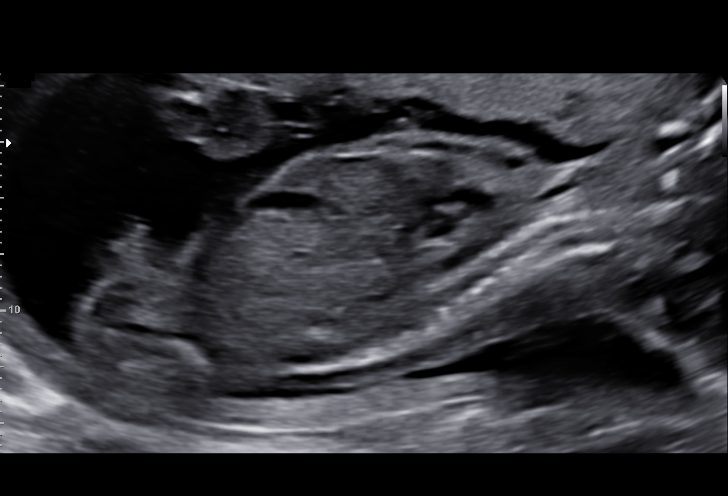
[im 112/117]
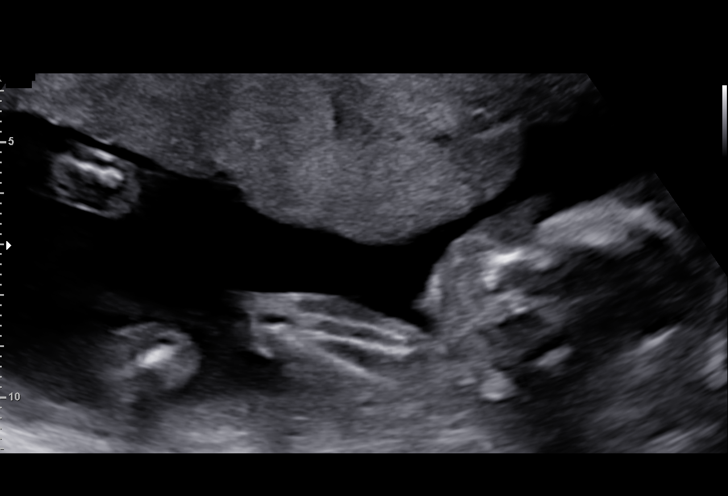

[13 of 28 positions shown; findings below may reference images not displayed]

ARISSA CNM

Indications

 Obesity complicating pregnancy, second
 trimester (BMI 32)
 19 weeks gestation of pregnancy
 Encounter for antenatal screening for
 malformations
 Size of fetus inconsistent with dates in
 second trimester
 LR NIPS
 Neg Horizon
 Hx of miscarriage x2
Fetal Evaluation

 Num Of Fetuses:         1
 Preg. Location:         Intrauterine
 Fetal Heart Rate(bpm):  141
 Cardiac Activity:       Observed
 Presentation:           Variable
 Placenta:               Anterior
 P. Cord Insertion:      Visualized, central

 Amniotic Fluid
 AFI FV:      Within normal limits

                             Largest Pocket(cm)

Biometry

 BPD:        44  mm     G. Age:  19w 2d         33  %    CI:        70.05   %    70 - 86
                                                         FL/HC:      17.8   %    16.8 -
 HC:      167.7  mm     G. Age:  19w 3d         29  %    HC/AC:      1.13        1.09 -
 AC:       148   mm     G. Age:  20w 1d         57  %    FL/BPD:     68.0   %
 FL:       29.9  mm     G. Age:  19w 2d         26  %    FL/AC:      20.2   %    20 - 24
 HUM:      31.2  mm     G. Age:  20w 3d         70  %
 CER:      22.2  mm     G. Age:  20w 6d         93  %
 NFT:       4.3  mm
 LV:          6  mm
 CM:        4.7  mm

 Est. FW:     305  gm    0 lb 11 oz      42  %
OB History

 Blood Type:   O+
 Gravidity:    3         Term:   0        Prem:   0        SAB:   2
 TOP:          0       Ectopic:  0        Living: 0
Gestational Age

 LMP:           22w 1d        Date:  01/17/20                 EDD:   10/23/20
 U/S Today:     19w 4d                                        EDD:   11/10/20
 Best:          19w 5d     Det. By:  U/S  (05/23/20)          EDD:   11/09/20
Anatomy

 Cranium:               Appears normal         LVOT:                   Appears normal
 Cavum:                 Appears normal         Aortic Arch:            Not well visualized
 Ventricles:            Appears normal         Ductal Arch:            Appears normal
 Choroid Plexus:        Appears normal         Diaphragm:              Appears normal
 Cerebellum:            Appears normal         Stomach:                Appears normal, left
                                                                       sided
 Posterior Fossa:       Appears normal         Abdomen:                Appears normal
 Nuchal Fold:           Appears normal         Abdominal Wall:         Appears nml (cord
                                                                       insert, abd wall)
 Face:                  Appears normal         Cord Vessels:           Appears normal (3
                        (orbits and profile)                           vessel cord)
 Lips:                  Limited                Kidneys:                Appear normal
 Palate:                Limited                Bladder:                Appears normal
 Thoracic:              Appears normal         Spine:                  Not well visualized
 Heart:                 Not well visualized    Upper Extremities:      Visualized
 RVOT:                  Appears normal         Lower Extremities:      Visualized

 Other:  Fetus appears to be a male. BCV appears normal
Cervix Uterus Adnexa

 Cervix
 Length:           4.35  cm.
 Normal appearance by transabdominal scan.

 Uterus
 Appears normal
 Right Ovary
 Size(cm)     3.15   x   2.73   x  3.02      Vol(ml):
 Within normal limits.

 Left Ovary
 Not visualized.

 Cul De Sac
 No free fluid seen.

 Adnexa
 Appears normal
Comments

 This patient was seen for a detailed fetal anatomy scan due
 to maternal obesity.
 She denies any significant past medical history and denies
 any problems in her current pregnancy.
 She had a cell free DNA test earlier in her pregnancy which
 indicated a low risk for trisomy 21, 18, and 13. A male fetus is
 predicted.
 She was informed that the fetal growth and amniotic fluid
 level were appropriate for her gestational age.
 There were no obvious fetal anomalies noted on today's
 ultrasound exam.  However, the views of the fetal anatomy
 were limited today due to the fetal position.
 The patient was informed that anomalies may be missed due
 to technical limitations. If the fetus is in a suboptimal position
 or maternal habitus is increased, visualization of the fetus in
 the maternal uterus may be impaired.
 A follow-up exam was scheduled in 4 weeks to complete the
 views of the fetal anatomy.

## 2022-10-24 ENCOUNTER — Ambulatory Visit (INDEPENDENT_AMBULATORY_CARE_PROVIDER_SITE_OTHER): Payer: 59

## 2022-10-24 VITALS — BP 139/74 | HR 80 | Wt 186.6 lb

## 2022-10-24 DIAGNOSIS — Z3480 Encounter for supervision of other normal pregnancy, unspecified trimester: Secondary | ICD-10-CM

## 2022-10-24 DIAGNOSIS — O10919 Unspecified pre-existing hypertension complicating pregnancy, unspecified trimester: Secondary | ICD-10-CM

## 2022-10-24 DIAGNOSIS — O24311 Unspecified pre-existing diabetes mellitus in pregnancy, first trimester: Secondary | ICD-10-CM

## 2022-10-24 DIAGNOSIS — Z3A15 15 weeks gestation of pregnancy: Secondary | ICD-10-CM

## 2022-10-24 NOTE — Progress Notes (Signed)
Pt presents for ROB visit. No concerns.  

## 2022-10-24 NOTE — Patient Instructions (Signed)

## 2022-10-24 NOTE — Progress Notes (Signed)
   HIGH-RISK PREGNANCY OFFICE VISIT  Patient name: Lisa Ritter MRN [id number]  Date of birth: 04-05-1990 Chief Complaint:   Routine Prenatal Visit  Subjective:   Ardena Gangl is a 32 y.o. G58P1021 female at [redacted]w[redacted]d with an Estimated Date of Delivery: 04/15/23 being seen today for ongoing management of a high-risk pregnancy aeb has Obesity affecting pregnancy, antepartum; History of miscarriage, currently pregnant; Chronic hypertension affecting pregnancy; History of gestational diabetes; Delivery of pregnancy by cesarean section; Supervision of high risk pregnancy, antepartum; and Preexisting diabetes complicating pregnancy in first trimester, antepartum on their problem list.  Patient presents today, alone, with  pressure .  She states she notes when standing for extended periods of time. Patient report some flutters. Patient denies abdominal cramping or contractions.  Patient denies vaginal concerns including abnormal discharge, leaking of fluid, and bleeding. No issues with urination or diarrhea. She reports constipation stating that she has gone from daily to every other day, but not hard to pass.    Contractions: Not present. Vag. Bleeding: None.  Movement: Present.  Reviewed past medical,surgical, social, obstetrical and family history as well as problem list, medications and allergies.  Objective   Vitals:   10/24/22 1536  BP: 139/74  Pulse: 80  Weight: 186 lb 9.6 oz (84.6 kg)  Body mass index is 34.13 kg/m.  Total Weight Gain:-6 lb 6.4 oz (-2.903 kg)         Physical Examination:   General appearance: Well appearing, and in no distress  Mental status: Alert, oriented to person, place, and time  Skin: Warm & dry  Cardiovascular: Normal heart rate noted  Respiratory: Normal respiratory effort, no distress  Abdomen: Gravid  Pelvic: Cervical exam deferred           Extremities: Edema: None  Fetal Status: Fetal Heart Rate (bpm): 155  Movement: Present   No results found for this  or any previous visit (from the past 24 hour(s)).  Assessment & Plan:  High-risk pregnancy of a 32 y.o., [id number] at [redacted]w[redacted]d with an Estimated Date of Delivery: 04/15/23   1. Supervision of other normal pregnancy, antepartum -Anticipatory guidance for upcoming appts. -Patient to schedule next appt in 4 weeks for an in-person visit. -Reassured that bowel habits change during pregnancy. -Encouraged to monitor and take OTC medications if necessary.   2. [redacted] weeks gestation of pregnancy -Doing well. -AFP Today  3. Chronic hypertension affecting pregnancy -Reports normotensive at home, but different on her two cuffs at home! -States babyscripts cuff is higher compared to her personal one.  -Normotensive today. -Continue to monitor and track.  -Taking bASA.  4. Preexisting diabetes complicating pregnancy in first trimester, antepartum -9/20-10/16: Fasting: 84-97, 101, 103 Breakfast 101-125, 135 Lunch:90-115,125- 128, 142,  Dinner: 89-118, 134, 179 -Encouraged to continue monitoring.    Meds: No orders of the defined types were placed in this encounter.  Labs/procedures today:  Lab Orders         AFP, Serum, Open Spina Bifida      Reviewed: Preterm labor symptoms and general obstetric precautions including but not limited to vaginal bleeding, contractions, leaking of fluid and fetal movement were reviewed in detail with the patient.  All questions were answered.  Follow-up: Return in about 4 weeks (around 11/21/2022).  Orders Placed This Encounter  Procedures   AFP, Serum, Open Spina Bifida   Cherre Robins MSN, CNM 10/24/2022

## 2022-10-26 LAB — AFP, SERUM, OPEN SPINA BIFIDA
AFP MoM: 1.14
AFP Value: 29.8 ng/mL
Gest. Age on Collection Date: 15.2 wk
Maternal Age At EDD: 33.7 a
OSBR Risk 1 IN: 7850
Test Results:: NEGATIVE
Weight: 186 [lb_av]

## 2022-11-19 ENCOUNTER — Ambulatory Visit: Payer: 59 | Attending: Obstetrics & Gynecology

## 2022-11-19 ENCOUNTER — Encounter: Payer: Self-pay | Admitting: *Deleted

## 2022-11-19 ENCOUNTER — Ambulatory Visit: Payer: 59 | Admitting: *Deleted

## 2022-11-19 ENCOUNTER — Other Ambulatory Visit: Payer: Self-pay

## 2022-11-19 ENCOUNTER — Other Ambulatory Visit: Payer: Self-pay | Admitting: *Deleted

## 2022-11-19 VITALS — BP 138/65 | HR 81

## 2022-11-19 DIAGNOSIS — O0990 Supervision of high risk pregnancy, unspecified, unspecified trimester: Secondary | ICD-10-CM | POA: Insufficient documentation

## 2022-11-19 DIAGNOSIS — Z8632 Personal history of gestational diabetes: Secondary | ICD-10-CM | POA: Diagnosis not present

## 2022-11-19 DIAGNOSIS — E669 Obesity, unspecified: Secondary | ICD-10-CM | POA: Diagnosis not present

## 2022-11-19 DIAGNOSIS — O99212 Obesity complicating pregnancy, second trimester: Secondary | ICD-10-CM | POA: Insufficient documentation

## 2022-11-19 DIAGNOSIS — O34219 Maternal care for unspecified type scar from previous cesarean delivery: Secondary | ICD-10-CM | POA: Diagnosis not present

## 2022-11-19 DIAGNOSIS — Z3A19 19 weeks gestation of pregnancy: Secondary | ICD-10-CM | POA: Insufficient documentation

## 2022-11-19 DIAGNOSIS — N858 Other specified noninflammatory disorders of uterus: Secondary | ICD-10-CM | POA: Diagnosis not present

## 2022-11-19 DIAGNOSIS — Z363 Encounter for antenatal screening for malformations: Secondary | ICD-10-CM | POA: Diagnosis not present

## 2022-11-19 DIAGNOSIS — O0992 Supervision of high risk pregnancy, unspecified, second trimester: Secondary | ICD-10-CM | POA: Insufficient documentation

## 2022-11-19 DIAGNOSIS — O09292 Supervision of pregnancy with other poor reproductive or obstetric history, second trimester: Secondary | ICD-10-CM | POA: Insufficient documentation

## 2022-11-19 DIAGNOSIS — Z8759 Personal history of other complications of pregnancy, childbirth and the puerperium: Secondary | ICD-10-CM

## 2022-11-19 DIAGNOSIS — O09299 Supervision of pregnancy with other poor reproductive or obstetric history, unspecified trimester: Secondary | ICD-10-CM

## 2022-11-21 ENCOUNTER — Encounter: Payer: 59 | Admitting: Obstetrics and Gynecology

## 2022-11-22 ENCOUNTER — Ambulatory Visit (INDEPENDENT_AMBULATORY_CARE_PROVIDER_SITE_OTHER): Payer: 59 | Admitting: Obstetrics and Gynecology

## 2022-11-22 VITALS — BP 131/69 | HR 77 | Wt 189.4 lb

## 2022-11-22 DIAGNOSIS — Z3480 Encounter for supervision of other normal pregnancy, unspecified trimester: Secondary | ICD-10-CM

## 2022-11-22 DIAGNOSIS — O10919 Unspecified pre-existing hypertension complicating pregnancy, unspecified trimester: Secondary | ICD-10-CM

## 2022-11-22 DIAGNOSIS — Z3A19 19 weeks gestation of pregnancy: Secondary | ICD-10-CM

## 2022-11-22 DIAGNOSIS — O24311 Unspecified pre-existing diabetes mellitus in pregnancy, first trimester: Secondary | ICD-10-CM

## 2022-11-22 DIAGNOSIS — Z98891 History of uterine scar from previous surgery: Secondary | ICD-10-CM

## 2022-11-22 NOTE — Progress Notes (Signed)
   PRENATAL VISIT NOTE  Subjective:  Lisa Ritter is a 32 y.o. G4P1021 at [redacted]w[redacted]d being seen today for ongoing prenatal care.  She is currently monitored for the following issues for this high-risk pregnancy and has Obesity affecting pregnancy, antepartum; History of miscarriage, currently pregnant; Chronic hypertension affecting pregnancy; History of gestational diabetes; Delivery of pregnancy by cesarean section; Supervision of high risk pregnancy, antepartum; and Preexisting diabetes complicating pregnancy in first trimester, antepartum on their problem list.  Patient reports no complaints.  Contractions: Not present. Vag. Bleeding: None.  Movement: Present. Denies leaking of fluid.   The following portions of the patient's history were reviewed and updated as appropriate: allergies, current medications, past family history, past medical history, past social history, past surgical history and problem list.   Objective:   Vitals:   11/22/22 1042  BP: 131/69  Pulse: 77  Weight: 189 lb 6.4 oz (85.9 kg)    Fetal Status: Fetal Heart Rate (bpm): 150   Movement: Present     General:  Alert, oriented and cooperative. Patient is in no acute distress.  Skin: Skin is warm and dry. No rash noted.   Cardiovascular: Normal heart rate noted  Respiratory: Normal respiratory effort, no problems with respiration noted  Abdomen: Soft, gravid, appropriate for gestational age.  Pain/Pressure: Absent     Pelvic: Cervical exam deferred        Extremities: Normal range of motion.  Edema: None  Mental Status: Normal mood and affect. Normal behavior. Normal judgment and thought content.   Assessment and Plan:  Pregnancy: G4P1021 at [redacted]w[redacted]d 1. Supervision of other normal pregnancy, antepartum BP and FHR normal Doing well, feeling regular movement    2. Chronic hypertension affecting pregnancy Normotensive, continue ASA, no pec s&s   3. Preexisting diabetes complicating pregnancy in first trimester,  antepartum Fastings normal, majority postprandials within range with a few outliers, discussed dietary changes, exercise. Discussed might need medication later. Will try those changes and follow up.   4. History of C-section Desires TOLAC  5. [redacted] weeks gestation of pregnancy 11/11 u/s  efw 65%, afi normal  Follow up 1/10  Preterm labor symptoms and general obstetric precautions including but not limited to vaginal bleeding, contractions, leaking of fluid and fetal movement were reviewed in detail with the patient. Please refer to After Visit Summary for other counseling recommendations.   Return in 4 weeks for routine prenatal   Future Appointments  Date Time Provider Department Center  12/19/2022 10:35 AM Gerrit Heck, CNM [facility] None  01/18/2023 11:15 AM [facility] NURSE [facility]  01/18/2023 11:30 AM [facility] US3 [facility]    Albertine Grates, FNP

## 2022-12-05 IMAGING — US US ABDOMEN COMPLETE
2 series · 14 of 25 positions shown · non-contrast
Comparison: None.

CLINICAL DATA: Elevated liver enzymes

EXAM:
ABDOMEN ULTRASOUND COMPLETE

[Series 1: us abdomen complete · 1 of 4 slices shown]
[im 1/4]
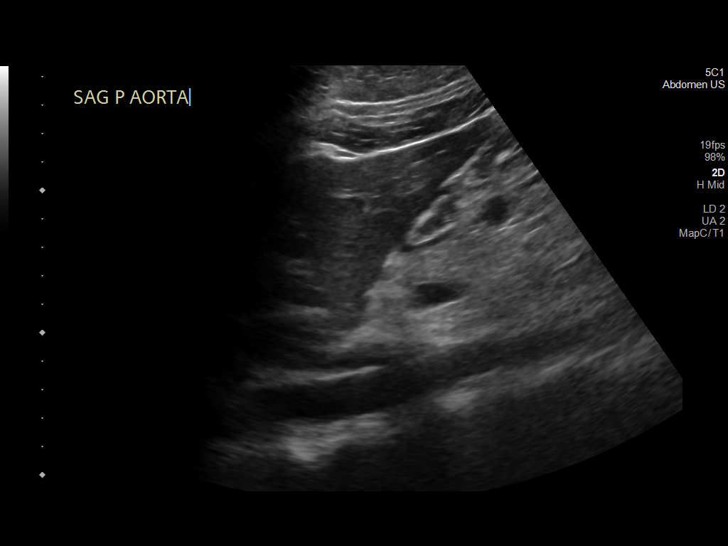

[Series 2: abdomen us · 13 of 76 slices shown]
[im 4/76]
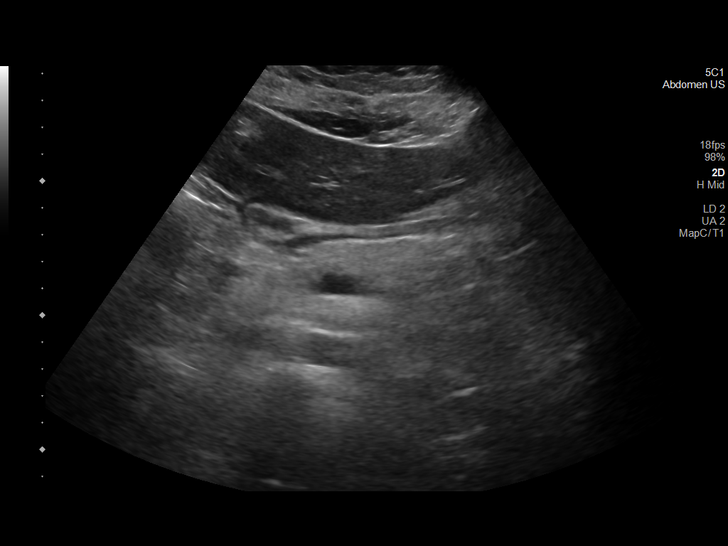
[im 10/76]
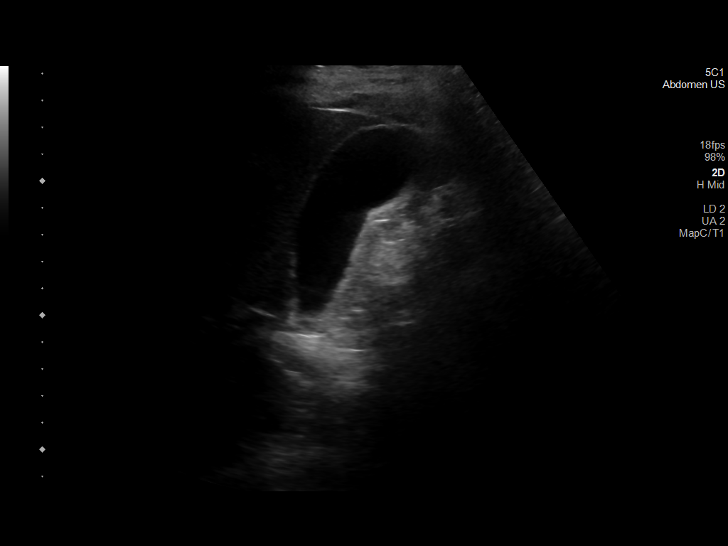
[im 17/76]
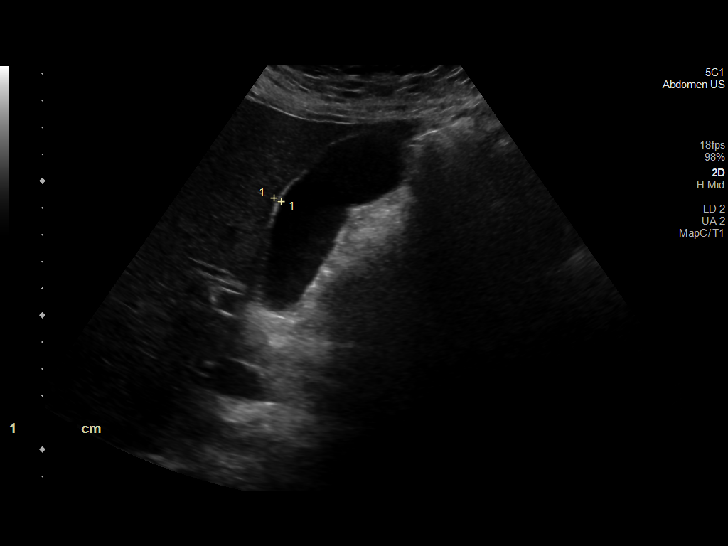
[im 23/76]
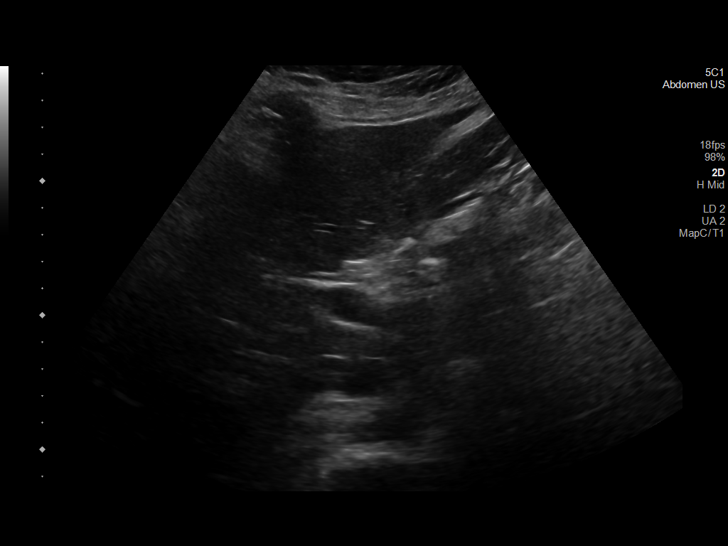
[im 27/76]
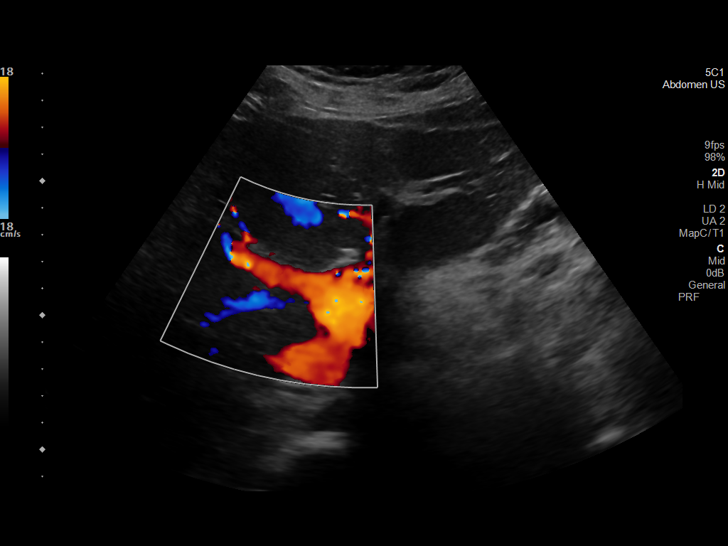
[im 33/76]
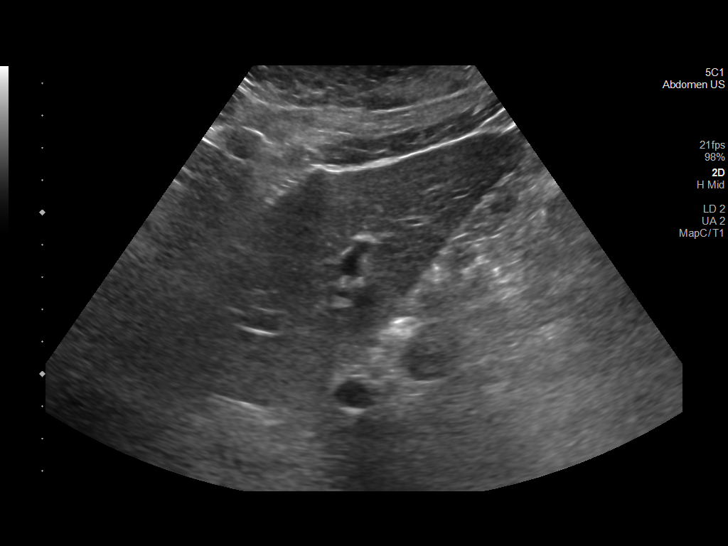
[im 40/76]
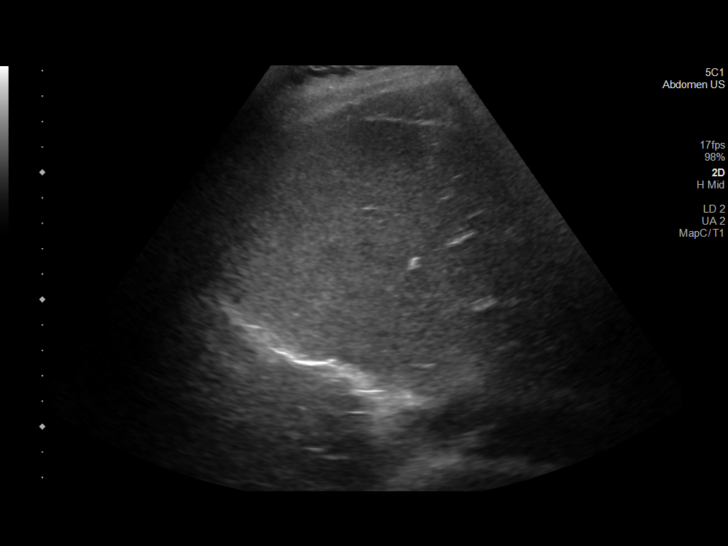
[im 46/76]
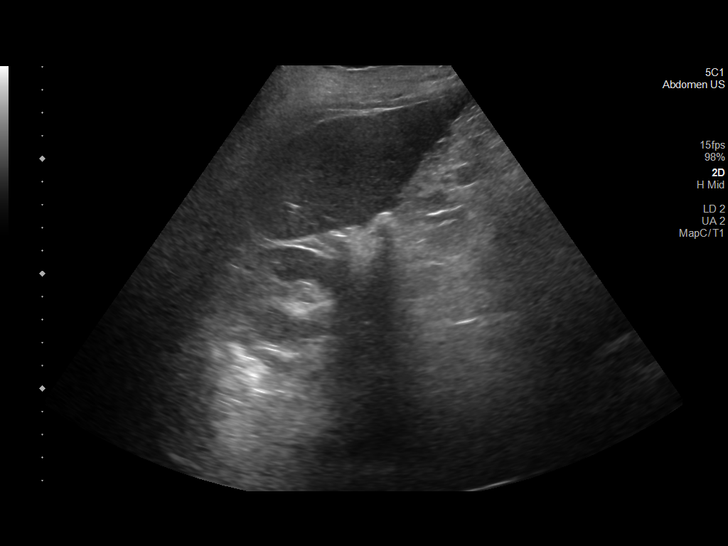
[im 49/76]
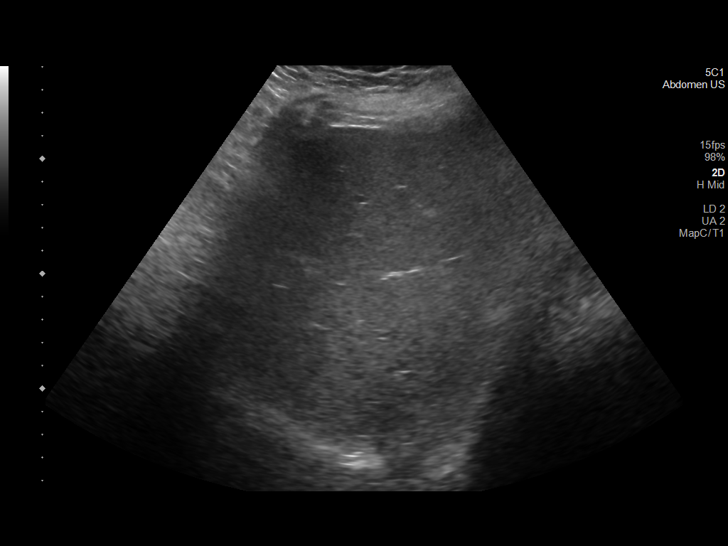
[im 56/76]
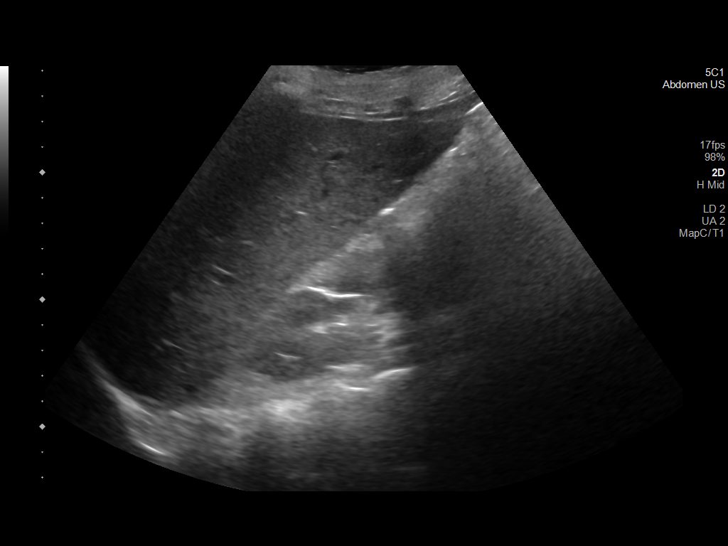
[im 62/76]
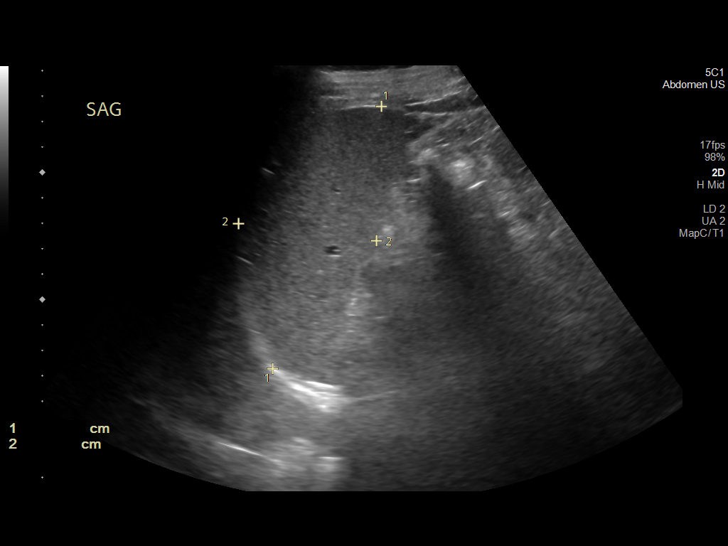
[im 69/76]
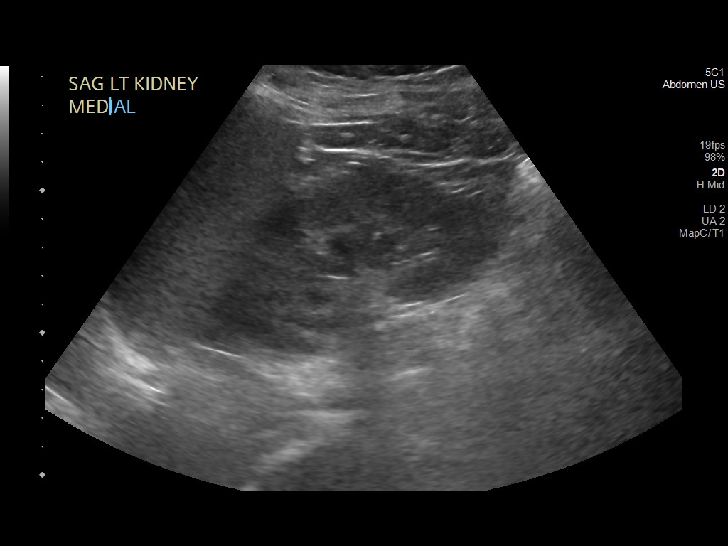
[im 76/76]
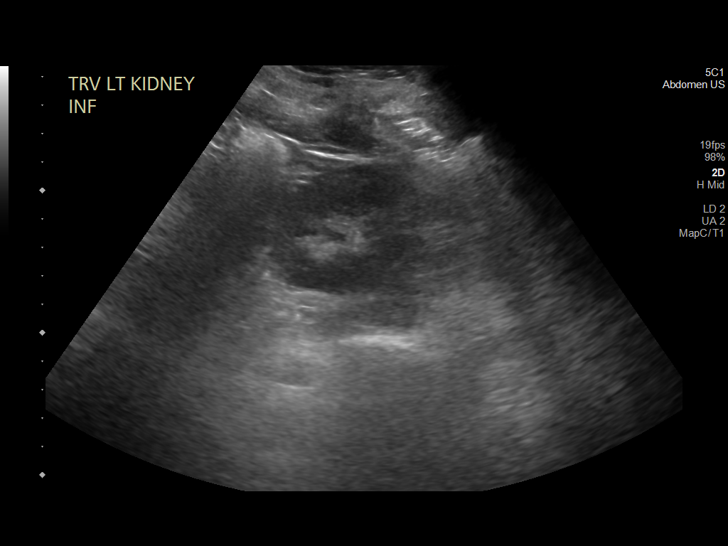

[14 of 25 positions shown; findings below may reference images not displayed]

FINDINGS: Gallbladder: No gallstones or wall thickening visualized. No
sonographic Murphy sign noted by sonographer.

Common bile duct: Diameter: 2 mm

Liver: No focal lesion identified. Within normal limits in
parenchymal echogenicity. Portal vein is patent on color Doppler
imaging with normal direction of blood flow towards the liver.

IVC: No abnormality visualized.

Pancreas: Visualized portion unremarkable.

Spleen: Size and appearance within normal limits.

Right Kidney: Length: 10.5 cm. Echogenicity within normal limits. No
mass or hydronephrosis visualized.

Left Kidney: Length: 13 cm. Echogenicity within normal limits. No
mass or hydronephrosis visualized.

Abdominal aorta: No aneurysm visualized.

Other findings: None.
IMPRESSION: Unremarkable ultrasound of the abdomen.

## 2022-12-19 ENCOUNTER — Encounter: Payer: 59 | Admitting: Obstetrics and Gynecology

## 2022-12-19 ENCOUNTER — Ambulatory Visit (INDEPENDENT_AMBULATORY_CARE_PROVIDER_SITE_OTHER): Payer: 59

## 2022-12-19 VITALS — BP 134/68 | HR 78 | Wt 192.0 lb

## 2022-12-19 DIAGNOSIS — O10012 Pre-existing essential hypertension complicating pregnancy, second trimester: Secondary | ICD-10-CM

## 2022-12-19 DIAGNOSIS — O10919 Unspecified pre-existing hypertension complicating pregnancy, unspecified trimester: Secondary | ICD-10-CM

## 2022-12-19 DIAGNOSIS — Z3A23 23 weeks gestation of pregnancy: Secondary | ICD-10-CM

## 2022-12-19 DIAGNOSIS — O24311 Unspecified pre-existing diabetes mellitus in pregnancy, first trimester: Secondary | ICD-10-CM

## 2022-12-19 DIAGNOSIS — O0990 Supervision of high risk pregnancy, unspecified, unspecified trimester: Secondary | ICD-10-CM

## 2022-12-19 DIAGNOSIS — O24312 Unspecified pre-existing diabetes mellitus in pregnancy, second trimester: Secondary | ICD-10-CM

## 2022-12-19 NOTE — Progress Notes (Signed)
HIGH-RISK PREGNANCY OFFICE VISIT  Patient name: Lisa Ritter MRN [id number]  Date of birth: 18-Dec-1990 Chief Complaint:   Routine Prenatal Visit  Subjective:   Lisa Ritter is a 32 y.o. 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  Patient presents today, alone, with  continued complaint of pressure .  Patient endorses fetal movement. Patient denies abdominal cramping or contractions.  Patient denies vaginal concerns including abnormal discharge, leaking of fluid, and bleeding. No issues with urination or diarrhea. She reports some constipation, but states it is managing without issues.    Contractions: Not present. Vag. Bleeding: None.  Movement: Present.  Reviewed past medical,surgical, social, obstetrical and family history as well as problem list, medications and allergies.  Objective   Vitals:   12/19/22 1045  BP: 134/68  Pulse: 78  Weight: 192 lb (87.1 kg)  Body mass index is 35.12 kg/m.  Total Weight Gain:-1 lb (-0.454 kg)         Physical Examination:   General appearance: Well appearing, and in no distress  Mental status: Alert, oriented to person, place, and time  Skin: Warm & dry  Cardiovascular: Normal heart rate noted  Respiratory: Normal respiratory effort, no distress  Abdomen: Soft, gravid, nontender, AGA with Fundal Height: 24 cm  Pelvic: Cervical exam deferred           Extremities: Edema: None  Fetal Status: Fetal Heart Rate (bpm): 144  Movement: Present   No results found for this or any previous visit (from the  past 24 hour(s)).  Assessment & Plan:  High-risk pregnancy of a 32 y.o., [id number] at [redacted]w[redacted]d with an Estimated Date of Delivery: 04/15/23   1. Supervision of high risk pregnancy, antepartum -Anticipatory guidance for upcoming appts. -Patient to schedule next appt in 3-4 weeks for an in-person visit. -Reviewed Glucola appt preparation including fasting the night before and morning of.   *Informed that it is okay to drink plain water throughout the night and prior to consumption of Glucola formula.  -Discussed anticipated office time of 2.5-3 hours.  -Reviewed blood draw procedures and labs which also include check of iron/HgB level, RPR, and HIV *Informed that repeat RPR/HIV are for pediatric records/compliance.  -Discussed how results of GTT are handled including diabetic education and BS testing for abnormal results and routine care for normal results.   2. Chronic hypertension affecting pregnancy -BP normotensive today. -Reports BP at home between 126-130s/60-70s  3. 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 [redacted] weeks gestation of pregnancy -Doing well.      Meds: No orders of the defined types were placed in this encounter.  Labs/procedures today:  Lab Orders  No laboratory test(s) ordered today     Reviewed: Preterm labor symptoms and general obstetric precautions including but not limited to vaginal bleeding, contractions, leaking of fluid and fetal movement were reviewed in detail with the patient.  All questions were answered.  Follow-up: Return in about 4 weeks (around 01/16/2023) for HROB with GTT.  No orders of the defined types were placed in this encounter.  Cherre Robins MSN, CNM 12/19/2022

## 2023-01-09 ENCOUNTER — Other Ambulatory Visit: Payer: 59

## 2023-01-14 DIAGNOSIS — O10919 Unspecified pre-existing hypertension complicating pregnancy, unspecified trimester: Secondary | ICD-10-CM | POA: Insufficient documentation

## 2023-01-14 DIAGNOSIS — R030 Elevated blood-pressure reading, without diagnosis of hypertension: Secondary | ICD-10-CM | POA: Insufficient documentation

## 2023-01-14 DIAGNOSIS — O34219 Maternal care for unspecified type scar from previous cesarean delivery: Secondary | ICD-10-CM | POA: Insufficient documentation

## 2023-01-16 ENCOUNTER — Encounter: Payer: 59 | Admitting: Obstetrics and Gynecology

## 2023-01-18 ENCOUNTER — Other Ambulatory Visit: Payer: Self-pay

## 2023-01-18 ENCOUNTER — Other Ambulatory Visit: Payer: Self-pay | Admitting: *Deleted

## 2023-01-18 ENCOUNTER — Ambulatory Visit: Payer: 59 | Admitting: *Deleted

## 2023-01-18 ENCOUNTER — Ambulatory Visit: Payer: 59 | Attending: Obstetrics and Gynecology

## 2023-01-18 VITALS — BP 142/71 | HR 91

## 2023-01-18 DIAGNOSIS — O0990 Supervision of high risk pregnancy, unspecified, unspecified trimester: Secondary | ICD-10-CM

## 2023-01-18 DIAGNOSIS — O34219 Maternal care for unspecified type scar from previous cesarean delivery: Secondary | ICD-10-CM

## 2023-01-18 DIAGNOSIS — Z8759 Personal history of other complications of pregnancy, childbirth and the puerperium: Secondary | ICD-10-CM

## 2023-01-18 DIAGNOSIS — O09299 Supervision of pregnancy with other poor reproductive or obstetric history, unspecified trimester: Secondary | ICD-10-CM

## 2023-01-18 DIAGNOSIS — E669 Obesity, unspecified: Secondary | ICD-10-CM

## 2023-01-18 DIAGNOSIS — O09292 Supervision of pregnancy with other poor reproductive or obstetric history, second trimester: Secondary | ICD-10-CM | POA: Diagnosis not present

## 2023-01-18 DIAGNOSIS — O10912 Unspecified pre-existing hypertension complicating pregnancy, second trimester: Secondary | ICD-10-CM

## 2023-01-18 DIAGNOSIS — O10919 Unspecified pre-existing hypertension complicating pregnancy, unspecified trimester: Secondary | ICD-10-CM | POA: Diagnosis present

## 2023-01-18 DIAGNOSIS — O99212 Obesity complicating pregnancy, second trimester: Secondary | ICD-10-CM

## 2023-01-18 DIAGNOSIS — O24410 Gestational diabetes mellitus in pregnancy, diet controlled: Secondary | ICD-10-CM | POA: Diagnosis not present

## 2023-01-18 DIAGNOSIS — Z8632 Personal history of gestational diabetes: Secondary | ICD-10-CM | POA: Insufficient documentation

## 2023-01-18 DIAGNOSIS — Z3A27 27 weeks gestation of pregnancy: Secondary | ICD-10-CM

## 2023-01-23 ENCOUNTER — Encounter: Payer: Self-pay | Admitting: Obstetrics and Gynecology

## 2023-01-23 ENCOUNTER — Ambulatory Visit: Payer: 59 | Admitting: Obstetrics and Gynecology

## 2023-01-23 VITALS — BP 126/68 | HR 80 | Wt 194.0 lb

## 2023-01-23 DIAGNOSIS — O10919 Unspecified pre-existing hypertension complicating pregnancy, unspecified trimester: Secondary | ICD-10-CM

## 2023-01-23 DIAGNOSIS — O24311 Unspecified pre-existing diabetes mellitus in pregnancy, first trimester: Secondary | ICD-10-CM

## 2023-01-23 DIAGNOSIS — O24112 Pre-existing diabetes mellitus, type 2, in pregnancy, second trimester: Secondary | ICD-10-CM

## 2023-01-23 DIAGNOSIS — O0990 Supervision of high risk pregnancy, unspecified, unspecified trimester: Secondary | ICD-10-CM

## 2023-01-23 DIAGNOSIS — E119 Type 2 diabetes mellitus without complications: Secondary | ICD-10-CM

## 2023-01-23 DIAGNOSIS — O10013 Pre-existing essential hypertension complicating pregnancy, third trimester: Secondary | ICD-10-CM

## 2023-01-23 DIAGNOSIS — Z3A28 28 weeks gestation of pregnancy: Secondary | ICD-10-CM

## 2023-01-23 NOTE — Progress Notes (Signed)
   PRENATAL VISIT NOTE  Subjective:  Lisa Ritter is a 33 y.o. G4P1021 at [redacted]w[redacted]d being seen today for ongoing prenatal care.  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  Patient reports no complaints.  Contractions: Not present. Vag. Bleeding: None.  Movement: Present. Denies leaking of fluid.   The following portions of the patient's history were reviewed and updated as appropriate: allergies, current medications, past family history, past medical history, past social history, past surgical history and problem list.   Objective:   Vitals:   01/23/23 1048  BP: 126/68  Pulse: 80  Weight: 194 lb (88 kg)    Fetal Status: Fetal Heart Rate (bpm): 144   Movement: Present     General:  Alert, oriented and cooperative. Patient is in no acute distress.  Skin: Skin is warm and dry. No rash noted.   Cardiovascular: Normal heart rate noted  Respiratory: Normal respiratory effort, no problems with respiration noted  Abdomen: Soft, gravid, appropriate for gestational age.  Pain/Pressure: Absent     Pelvic: Cervical exam deferred        Extremities: Normal range of motion.  Edema: None  Mental Status: Normal mood and affect. Normal behavior. Normal judgment and thought content.   Assessment and Plan:  Pregnancy: G4P1021 at [redacted]w[redacted]d 1. Supervision of high risk pregnancy, antepartum (Primary) BP and FHR normal Doing well, feeling regular movement    2. Chronic hypertension affecting pregnancy Normotensive, not on meds. Continue ASA Reports normal pressures at home   3.  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 Mostly surrounding diet. Discussed starting metformin , would really like to not be on medicine. Discussed dietary changes, increase protein intake and exercise. Will check log at next visit to assess glucose control and need for medication 1/10 u/s efw 40%, normal afi  Follow up u/s 2/6  4. [redacted] weeks gestation of pregnancy Labs collected today  - CBC - RPR - HIV Antibody (routine testing w rflx)   Preterm labor symptoms and general obstetric precautions including but not limited to vaginal bleeding, contractions, leaking of fluid and fetal movement were reviewed in detail with the patient. Please refer to After Visit Summary for other counseling recommendations.   Return in 2 weeks for routine prenatal   Future Appointments  Date Time Provider Department Center  02/06/2023 10:35 AM Zelma Hidden, FNP [facility] None  02/14/2023  2:15 PM [facility] NURSE [facility]  02/14/2023  2:30 PM [facility]3 [facility]  03/15/2023  3:15 PM [facility]URSE [facility]  03/15/2023  3:30 PM [facility] US3 [facility]    Susi Eric, FNP

## 2023-01-23 NOTE — Progress Notes (Signed)
 Pt presents for ROB visit. No concerns

## 2023-01-24 LAB — CBC
Hematocrit: 38.6 % (ref 34.0–46.6)
Hemoglobin: 12.5 g/dL (ref 11.1–15.9)
MCH: 28.9 pg (ref 26.6–33.0)
MCHC: 32.4 g/dL (ref 31.5–35.7)
MCV: 89 fL (ref 79–97)
Platelets: 330 10*3/uL (ref 150–450)
RBC: 4.32 x10E6/uL (ref 3.77–5.28)
RDW: 12.4 % (ref 11.7–15.4)
WBC: 12.3 10*3/uL — ABNORMAL HIGH (ref 3.4–10.8)

## 2023-01-24 LAB — HIV ANTIBODY (ROUTINE TESTING W REFLEX): HIV Screen 4th Generation wRfx: NONREACTIVE

## 2023-01-24 LAB — RPR: RPR Ser Ql: NONREACTIVE

## 2023-01-30 ENCOUNTER — Encounter: Payer: 59 | Admitting: Obstetrics and Gynecology

## 2023-02-06 ENCOUNTER — Ambulatory Visit (INDEPENDENT_AMBULATORY_CARE_PROVIDER_SITE_OTHER): Payer: 59 | Admitting: Nurse Practitioner

## 2023-02-06 VITALS — BP 131/82 | HR 88 | Wt 195.0 lb

## 2023-02-06 DIAGNOSIS — O10919 Unspecified pre-existing hypertension complicating pregnancy, unspecified trimester: Secondary | ICD-10-CM

## 2023-02-06 DIAGNOSIS — O0990 Supervision of high risk pregnancy, unspecified, unspecified trimester: Secondary | ICD-10-CM

## 2023-02-06 DIAGNOSIS — O24311 Unspecified pre-existing diabetes mellitus in pregnancy, first trimester: Secondary | ICD-10-CM

## 2023-02-06 DIAGNOSIS — O99210 Obesity complicating pregnancy, unspecified trimester: Secondary | ICD-10-CM

## 2023-02-06 DIAGNOSIS — Z6833 Body mass index (BMI) 33.0-33.9, adult: Secondary | ICD-10-CM

## 2023-02-06 DIAGNOSIS — O10013 Pre-existing essential hypertension complicating pregnancy, third trimester: Secondary | ICD-10-CM

## 2023-02-06 DIAGNOSIS — O34219 Maternal care for unspecified type scar from previous cesarean delivery: Secondary | ICD-10-CM

## 2023-02-06 DIAGNOSIS — O09299 Supervision of pregnancy with other poor reproductive or obstetric history, unspecified trimester: Secondary | ICD-10-CM

## 2023-02-06 DIAGNOSIS — Z3A30 30 weeks gestation of pregnancy: Secondary | ICD-10-CM

## 2023-02-06 MED ORDER — GLYBURIDE 2.5 MG PO TABS: 2.5000 mg | ORAL_TABLET | Freq: Every day | ORAL | 3 refills | Status: DC

## 2023-02-06 NOTE — Progress Notes (Signed)
PRENATAL VISIT NOTE  Subjective:  Lisa Ritter is a 33 y.o. G4P1021 at [redacted]w[redacted]d being seen today for ongoing prenatal care.  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  Patient reports no complaints.  Contractions: Not present. Vag. Bleeding: None.  Movement: Present. Denies leaking of fluid.   The following portions of the patient's history were reviewed and updated as appropriate: allergies, current medications, past family history, past medical history, past social history, past surgical history and problem list.   Objective:   Vitals:   02/06/23 1038  BP: 131/82  Pulse: 88  Weight: 195 lb (88.5 kg)    Fetal Status: Fetal Heart Rate (bpm): 144   Movement: Present     General:  Alert, oriented and cooperative. Patient is in no acute distress.  Skin: Skin is warm and dry. No rash noted.   Cardiovascular: Normal heart rate noted  Respiratory: Normal respiratory effort, no problems with respiration noted  Abdomen: Soft, gravid, appropriate for gestational age.  Pain/Pressure: Absent     Pelvic: Cervical exam deferred        Extremities: Normal range of motion.  Edema: None  Mental Status: Normal mood and affect. Normal behavior. Normal judgment and thought content.   Assessment and Plan:  Pregnancy: G4P1021 at [redacted]w[redacted]d  1. 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  Dispense: 30 tablet; Refill: 3  2. Chronic hypertension affecting pregnancy -BP's stable ( 01/18/23 BP was 142/71)  - Continue to monitor BP's and watch for gHTN or s/s of PreE  - Denies s/s of PreE at this time  3. [redacted] weeks gestation of pregnancy (Primary) - overall feeling well  - Referral to Nutrition and Diabetes Services  4. BMI 33.0-33.9,adult   5. H/O gestational diabetes in prior pregnancy, currently pregnant   Orders Placed This Encounter  Procedures   US Fetal Echocardiography    Standing Status:   Future    Expiration Date:   02/06/2024    Scheduling Instructions:     Please schedule ASAP    Reason for Exam (SYMPTOM  OR DIAGNOSIS REQUIRED):   Type 2 DM pregnant    Preferred Imaging Location?:   External   Referral to Nutrition and Diabetes Services    Referral Priority:   Routine    Referral Type:   Consultation    Referral Reason:   Specialty Services Required    Number of Visits Requested:   1     Preterm labor symptoms and general obstetric precautions including but not limited to vaginal bleeding, contractions, leaking of fluid and fetal movement were reviewed in detail with the patient. Please refer to After Visit Summary for other counseling recommendations.   Return in about 2 weeks (around 02/20/2023)  for [facility].  Future Appointments  Date Time Provider Department Center  02/14/2023  2:15 PM [facility] NURSE [facility]  02/14/2023  2:30 PM [facility]3 [facility]  02/20/2023  9:55 AM Sue Lush, FNP [facility] None  03/15/2023  3:15 PM [facility] NURSE [facility]  03/15/2023  3:30 PM [facility]3 [facility]   Marcell Barlow, MSN, WHNP-BC [facility], Center for [employer]

## 2023-02-06 NOTE — Progress Notes (Signed)
Taking Magnesium for constipation. Not sure of the amount.

## 2023-02-08 ENCOUNTER — Encounter: Payer: Self-pay | Admitting: Nurse Practitioner

## 2023-02-14 ENCOUNTER — Other Ambulatory Visit: Payer: Self-pay | Admitting: Obstetrics and Gynecology

## 2023-02-14 ENCOUNTER — Other Ambulatory Visit: Payer: Self-pay

## 2023-02-14 ENCOUNTER — Ambulatory Visit: Payer: 59 | Attending: Obstetrics and Gynecology

## 2023-02-14 ENCOUNTER — Other Ambulatory Visit: Payer: Self-pay | Admitting: *Deleted

## 2023-02-14 ENCOUNTER — Ambulatory Visit: Payer: 59 | Admitting: *Deleted

## 2023-02-14 VITALS — BP 137/68

## 2023-02-14 DIAGNOSIS — O0990 Supervision of high risk pregnancy, unspecified, unspecified trimester: Secondary | ICD-10-CM

## 2023-02-14 DIAGNOSIS — O24415 Gestational diabetes mellitus in pregnancy, controlled by oral hypoglycemic drugs: Secondary | ICD-10-CM | POA: Diagnosis not present

## 2023-02-14 DIAGNOSIS — O09299 Supervision of pregnancy with other poor reproductive or obstetric history, unspecified trimester: Secondary | ICD-10-CM | POA: Diagnosis present

## 2023-02-14 DIAGNOSIS — O10912 Unspecified pre-existing hypertension complicating pregnancy, second trimester: Secondary | ICD-10-CM | POA: Diagnosis present

## 2023-02-14 DIAGNOSIS — Z8632 Personal history of gestational diabetes: Secondary | ICD-10-CM | POA: Insufficient documentation

## 2023-02-14 DIAGNOSIS — O34219 Maternal care for unspecified type scar from previous cesarean delivery: Secondary | ICD-10-CM

## 2023-02-14 DIAGNOSIS — O99213 Obesity complicating pregnancy, third trimester: Secondary | ICD-10-CM

## 2023-02-14 DIAGNOSIS — O09293 Supervision of pregnancy with other poor reproductive or obstetric history, third trimester: Secondary | ICD-10-CM | POA: Diagnosis not present

## 2023-02-14 DIAGNOSIS — E669 Obesity, unspecified: Secondary | ICD-10-CM | POA: Diagnosis not present

## 2023-02-14 DIAGNOSIS — O99212 Obesity complicating pregnancy, second trimester: Secondary | ICD-10-CM | POA: Diagnosis present

## 2023-02-14 DIAGNOSIS — Z3A31 31 weeks gestation of pregnancy: Secondary | ICD-10-CM

## 2023-02-14 DIAGNOSIS — O3660X0 Maternal care for excessive fetal growth, unspecified trimester, not applicable or unspecified: Secondary | ICD-10-CM

## 2023-02-20 ENCOUNTER — Ambulatory Visit (INDEPENDENT_AMBULATORY_CARE_PROVIDER_SITE_OTHER): Payer: 59 | Admitting: Obstetrics and Gynecology

## 2023-02-20 VITALS — BP 122/71 | HR 98 | Wt 195.0 lb

## 2023-02-20 DIAGNOSIS — Z3A32 32 weeks gestation of pregnancy: Secondary | ICD-10-CM

## 2023-02-20 DIAGNOSIS — O0990 Supervision of high risk pregnancy, unspecified, unspecified trimester: Secondary | ICD-10-CM | POA: Diagnosis not present

## 2023-02-20 DIAGNOSIS — O10919 Unspecified pre-existing hypertension complicating pregnancy, unspecified trimester: Secondary | ICD-10-CM | POA: Diagnosis not present

## 2023-02-20 DIAGNOSIS — O10913 Unspecified pre-existing hypertension complicating pregnancy, third trimester: Secondary | ICD-10-CM

## 2023-02-20 DIAGNOSIS — O24313 Unspecified pre-existing diabetes mellitus in pregnancy, third trimester: Secondary | ICD-10-CM | POA: Diagnosis not present

## 2023-02-20 DIAGNOSIS — O24311 Unspecified pre-existing diabetes mellitus in pregnancy, first trimester: Secondary | ICD-10-CM

## 2023-02-20 DIAGNOSIS — O34219 Maternal care for unspecified type scar from previous cesarean delivery: Secondary | ICD-10-CM | POA: Diagnosis not present

## 2023-02-20 NOTE — Progress Notes (Signed)
   PRENATAL VISIT NOTE  Subjective:  Lisa Ritter is a 33 y.o. G4P1021 at [redacted]w[redacted]d being seen today for ongoing prenatal care.  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  Patient reports no complaints.  Contractions: Not present. Vag. Bleeding: None.  Movement: Present. Denies leaking of fluid.   The following portions of the patient's history were reviewed and updated as appropriate: allergies, current medications, past family history, past medical history, past social history, past surgical history and problem list.   Objective:   Vitals:   02/20/23 1005 02/20/23 1029  BP: (!) 143/77 122/71  Pulse: 98   Weight: 195 lb (88.5 kg)     Fetal Status: Fetal Heart Rate (bpm): 148   Movement: Present     General:  Alert, oriented and cooperative. Patient is in no acute distress.  Skin: Skin is warm and dry. No rash noted.   Cardiovascular: Normal heart rate noted  Respiratory: Normal respiratory effort, no problems with respiration noted  Abdomen: Soft, gravid, appropriate for gestational age.  Pain/Pressure: Absent     Pelvic: Cervical exam deferred        Extremities: Normal range of motion.  Edema: Trace  Mental Status: Normal mood and affect. Normal behavior. Normal judgment and thought content.   Assessment and Plan:  Pregnancy: G4P1021 at [redacted]w[redacted]d 1. Supervision of high risk pregnancy, antepartum (Primary)  FHR normal Doing well, feeling vigorous movement   2. Previous cesarean delivery affecting pregnancy, antepartum Planning TOLAC, sent consent home to look at with  husband  3. 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 Chronic hypertension affecting pregnancy Initial elevated, repeat normal Bps at home 120s-130s/60-70s No HA/visual changes   Preterm labor symptoms and general obstetric precautions including but not limited to vaginal bleeding, contractions, leaking of fluid and fetal movement were reviewed in detail with the patient. Please refer to After Visit Summary for other counseling recommendations.   Return in about 2 weeks (around 03/06/2023) for OB VISIT (MD or APP).  Future Appointments  Date Time Provider Department Center  02/21/2023  3:15 PM [facility] NURSE [facility]  02/21/2023  3:30 PM [facility]4 [facility]  02/28/2023  2:15 PM [facility]URSE [facility]  02/28/2023  2:30 PM [facility] US7 [facility]  03/06/2023  1:30 PM Sue Lush, FNP [facility] None  03/07/2023  2:15 PM [facility] NURSE [facility]  03/07/2023  2:30 PM [facility] US1 [facility]  03/15/2023  3:15 PM [facility] NURSE [facility]  03/15/2023  3:30 PM [facility]3 [facility]      Albertine Grates, FNP

## 2023-02-20 NOTE — Progress Notes (Signed)
Slight swelling in ankles intermittent.

## 2023-02-21 ENCOUNTER — Ambulatory Visit: Payer: 59 | Admitting: *Deleted

## 2023-02-21 ENCOUNTER — Ambulatory Visit: Payer: 59 | Attending: Maternal & Fetal Medicine

## 2023-02-21 ENCOUNTER — Other Ambulatory Visit: Payer: Self-pay

## 2023-02-21 VITALS — BP 125/62 | HR 75

## 2023-02-21 DIAGNOSIS — O99213 Obesity complicating pregnancy, third trimester: Secondary | ICD-10-CM

## 2023-02-21 DIAGNOSIS — E669 Obesity, unspecified: Secondary | ICD-10-CM | POA: Diagnosis not present

## 2023-02-21 DIAGNOSIS — O09293 Supervision of pregnancy with other poor reproductive or obstetric history, third trimester: Secondary | ICD-10-CM | POA: Diagnosis not present

## 2023-02-21 DIAGNOSIS — O24415 Gestational diabetes mellitus in pregnancy, controlled by oral hypoglycemic drugs: Secondary | ICD-10-CM

## 2023-02-21 DIAGNOSIS — O0990 Supervision of high risk pregnancy, unspecified, unspecified trimester: Secondary | ICD-10-CM | POA: Insufficient documentation

## 2023-02-21 DIAGNOSIS — O3660X0 Maternal care for excessive fetal growth, unspecified trimester, not applicable or unspecified: Secondary | ICD-10-CM | POA: Insufficient documentation

## 2023-02-21 DIAGNOSIS — Z3A32 32 weeks gestation of pregnancy: Secondary | ICD-10-CM

## 2023-02-22 ENCOUNTER — Encounter: Payer: Self-pay | Admitting: *Deleted

## 2023-02-22 DIAGNOSIS — O365990 Maternal care for other known or suspected poor fetal growth, unspecified trimester, not applicable or unspecified: Secondary | ICD-10-CM | POA: Insufficient documentation

## 2023-02-22 DIAGNOSIS — O24419 Gestational diabetes mellitus in pregnancy, unspecified control: Secondary | ICD-10-CM | POA: Insufficient documentation

## 2023-02-28 ENCOUNTER — Ambulatory Visit: Payer: 59 | Attending: Maternal & Fetal Medicine | Admitting: *Deleted

## 2023-02-28 ENCOUNTER — Ambulatory Visit (HOSPITAL_BASED_OUTPATIENT_CLINIC_OR_DEPARTMENT_OTHER): Payer: 59 | Admitting: *Deleted

## 2023-02-28 ENCOUNTER — Ambulatory Visit: Payer: 59

## 2023-02-28 VITALS — BP 136/60 | HR 72

## 2023-02-28 DIAGNOSIS — E119 Type 2 diabetes mellitus without complications: Secondary | ICD-10-CM | POA: Insufficient documentation

## 2023-02-28 DIAGNOSIS — O3663X0 Maternal care for excessive fetal growth, third trimester, not applicable or unspecified: Secondary | ICD-10-CM | POA: Diagnosis not present

## 2023-02-28 DIAGNOSIS — O10919 Unspecified pre-existing hypertension complicating pregnancy, unspecified trimester: Secondary | ICD-10-CM | POA: Insufficient documentation

## 2023-02-28 DIAGNOSIS — O24415 Gestational diabetes mellitus in pregnancy, controlled by oral hypoglycemic drugs: Secondary | ICD-10-CM

## 2023-02-28 DIAGNOSIS — Z3A33 33 weeks gestation of pregnancy: Secondary | ICD-10-CM | POA: Diagnosis not present

## 2023-02-28 DIAGNOSIS — O24113 Pre-existing diabetes mellitus, type 2, in pregnancy, third trimester: Secondary | ICD-10-CM

## 2023-02-28 DIAGNOSIS — O0990 Supervision of high risk pregnancy, unspecified, unspecified trimester: Secondary | ICD-10-CM

## 2023-02-28 NOTE — Procedures (Addendum)
Lisa Ritter [birth year] [redacted]w[redacted]d  Fetus A Non-Stress Test Interpretation for 02/28/23  Indication: Chronic Hypertenstion and pre-existing type 2 DM  Fetal Heart Rate A Mode: External Baseline Rate (A): 150 bpm Variability: Moderate Accelerations: 15 x 15 Decelerations: None Multiple birth?: No  Uterine Activity Mode: Toco Contraction Frequency (min): None Resting Tone Palpated: Relaxed  Interpretation (Fetal Testing) Nonstress Test Interpretation: Reactive Comments: Tracing reviewed by Dr. Judeth Cornfield  MFM Note A modified BPP was performed. Reviewed NST, which is reactive. A bedside ultrasound was performed. Cephalic presentation. Anterior placenta. Normal amniotic fluid (AFI=8 cm; Q2 discarded). I reassured the patient of the findings.

## 2023-03-06 ENCOUNTER — Ambulatory Visit (INDEPENDENT_AMBULATORY_CARE_PROVIDER_SITE_OTHER): Payer: 59 | Admitting: Obstetrics and Gynecology

## 2023-03-06 VITALS — BP 130/76 | HR 88 | Wt 197.0 lb

## 2023-03-06 DIAGNOSIS — O0990 Supervision of high risk pregnancy, unspecified, unspecified trimester: Secondary | ICD-10-CM

## 2023-03-06 DIAGNOSIS — O10919 Unspecified pre-existing hypertension complicating pregnancy, unspecified trimester: Secondary | ICD-10-CM

## 2023-03-06 DIAGNOSIS — O24311 Unspecified pre-existing diabetes mellitus in pregnancy, first trimester: Secondary | ICD-10-CM | POA: Diagnosis not present

## 2023-03-06 DIAGNOSIS — O34219 Maternal care for unspecified type scar from previous cesarean delivery: Secondary | ICD-10-CM

## 2023-03-06 DIAGNOSIS — Z3A34 34 weeks gestation of pregnancy: Secondary | ICD-10-CM

## 2023-03-06 NOTE — Progress Notes (Signed)
 Pt states her sugars have been controlled at home. Pt has BPP scheduled tomorrow.

## 2023-03-06 NOTE — Progress Notes (Signed)
   PRENATAL VISIT NOTE  Subjective:  Lisa Ritter is a 33 y.o. G4P1021 at [redacted]w[redacted]d being seen today for ongoing prenatal care.  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  Patient reports  has been feeling more pelvic pressure  .  Contractions: Not present. Vag. Bleeding: None.  Movement: Present. Denies leaking of fluid.   The following portions of the patient's history were reviewed and updated as appropriate: allergies, current medications, past family history, past medical history, past social history, past surgical history and problem list.   Objective:   Vitals:   03/06/23 1338  BP: 130/76  Pulse: 88  Weight: 197 lb (89.4 kg)    Fetal Status:     Movement: Present     General:  Alert, oriented and cooperative. Patient is in no acute distress.  Skin: Skin is warm and dry. No rash noted.   Cardiovascular: Normal heart rate noted  Respiratory: Normal respiratory effort, no problems with respiration noted  Abdomen: Soft, gravid, appropriate for gestational age.  Pain/Pressure: Present     Pelvic: Cervical exam deferred        Extremities: Normal range of motion.     Mental Status: Normal mood and affect. Normal behavior. Normal judgment and thought content.   Assessment and Plan:  Pregnancy: G4P1021 at [redacted]w[redacted]d 1. Supervision of high risk pregnancy, antepartum (Primary) BP and FHR normal Doing well, feeling regular movement    2. Previous cesarean delivery affecting pregnancy, antepartum Desires TOLAC, signed consent today   3.  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 Chronic hypertension affecting pregnancy Normotensive, no PEC s&s Continue ASA  5. [redacted] weeks gestation of pregnancy Anticipatory guidance regarding 36 week swabs   Preterm labor symptoms and general obstetric precautions including but not limited to vaginal bleeding, contractions, leaking of fluid and fetal movement were reviewed in detail with the patient. Please refer to After Visit Summary for other counseling recommendations.   Return in about 2 weeks (around 03/20/2023) for OB VISIT (MD or APP).  Future Appointments  Date Time Provider Department Center  03/07/2023  2:15 PM [facility] NURSE [facility]  03/07/2023  2:30 PM [facility] US1 [facility]  03/15/2023  3:15 PM [facility] NURSE [facility]  03/15/2023  3:30 PM [facility] US3 [facility]    Albertine Grates, FNP

## 2023-03-07 ENCOUNTER — Ambulatory Visit (HOSPITAL_BASED_OUTPATIENT_CLINIC_OR_DEPARTMENT_OTHER): Payer: 59 | Admitting: Maternal & Fetal Medicine

## 2023-03-07 ENCOUNTER — Ambulatory Visit: Payer: 59 | Attending: Maternal & Fetal Medicine

## 2023-03-07 ENCOUNTER — Ambulatory Visit: Payer: 59

## 2023-03-07 ENCOUNTER — Other Ambulatory Visit: Payer: Self-pay

## 2023-03-07 VITALS — BP 125/73

## 2023-03-07 VITALS — BP 141/69 | HR 82

## 2023-03-07 DIAGNOSIS — O24415 Gestational diabetes mellitus in pregnancy, controlled by oral hypoglycemic drugs: Secondary | ICD-10-CM

## 2023-03-07 DIAGNOSIS — O133 Gestational [pregnancy-induced] hypertension without significant proteinuria, third trimester: Secondary | ICD-10-CM

## 2023-03-07 DIAGNOSIS — E669 Obesity, unspecified: Secondary | ICD-10-CM

## 2023-03-07 DIAGNOSIS — R030 Elevated blood-pressure reading, without diagnosis of hypertension: Secondary | ICD-10-CM | POA: Insufficient documentation

## 2023-03-07 DIAGNOSIS — O3660X0 Maternal care for excessive fetal growth, unspecified trimester, not applicable or unspecified: Secondary | ICD-10-CM | POA: Insufficient documentation

## 2023-03-07 DIAGNOSIS — O0990 Supervision of high risk pregnancy, unspecified, unspecified trimester: Secondary | ICD-10-CM

## 2023-03-07 DIAGNOSIS — O09293 Supervision of pregnancy with other poor reproductive or obstetric history, third trimester: Secondary | ICD-10-CM

## 2023-03-07 DIAGNOSIS — Z3A34 34 weeks gestation of pregnancy: Secondary | ICD-10-CM

## 2023-03-07 DIAGNOSIS — O99213 Obesity complicating pregnancy, third trimester: Secondary | ICD-10-CM

## 2023-03-07 NOTE — Progress Notes (Signed)
 Patient information  Patient Name: Lisa Ritter  Patient MRN:   [id number]  Referring practice: MFM Referring Provider: [facility])  MFM CONSULT  Lisa Ritter is a 33 y.o. [phone] at [redacted]w[redacted]d here for ultrasound and consultation. 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  RE elevated blood pressure: The patient was told that she had chronic hypertension and a previous pregnancy and required medication.  She had little medical care between her 2 pregnancies which lasted about a year.  During this pregnancy she has had occasional elevated blood pressures but the repeat has always been normal.  I discussed that we will not diagnose her with chronic hypertension unless she has 2 blood pressures greater than 140/90 that are elevated within 15 minutes and then again 4 hours later on at least two occasions.  She also needs to follow-up with her primary care provider after her pregnancy.  RE gestational diabetes: There was concern the patient may have type 2 diabetes versus early onset gestational diabetes.  Since the patient's hemoglobin A1c was 5.5 but she has required medication to achieve proper glycemic control, I think it is appropriate to diagnose her with early onset gestational diabetes. To confirm the diagnosis she needs a 75 gram 2-hour glucose challenge test at least 12 weeks after delivery to confirm or rule out type 2 diabetes.  Patient reports that her blood sugars have  been well-controlled since adding glyburide.  Most of her fastings are less than 90 and most for 2 hours are less than 120.  Sonographic findings Single intrauterine pregnancy. Fetal cardiac activity: Observed. Presentation: Cephalic. Interval fetal anatomy appears normal. Amniotic fluid: Within normal limits.  MVP: 3.44 cm. Placenta: Anterior. BPP: 8/8.   Recommendations 1. Continue weekly antenatal testing until delivery 2. Growth ultrasounds every 4 weeks until delivery  3. Delivery around 38-[redacted] weeks gestation or sooner if clinically indicated   Review of Systems: A review of systems was performed and was negative except per HPI   Vitals and Physical Exam    03/07/2023    3:18 PM 03/07/2023    2:21 PM 03/06/2023    1:38 PM  Vitals with BMI  Weight   197 lbs  Systolic 125 141 147  Diastolic 73 69 76  Pulse  82 88    Sitting comfortably on the sonogram table Nonlabored breathing Normal rate and rhythm Abdomen is nontender  Past pregnancies OB History  Gravida Para Term Preterm AB Living  4 1 1  0 2 1  SAB IAB Ectopic Multiple Live Births  2 0 0 0 1    # Outcome Date GA Lbr Len/2nd Weight Sex Type Anes PTL Lv  4 Current           3 Term 10/20/20 [redacted]w[redacted]d  4 lb 10 oz (2.098 kg) M CS-LTranv EPI  LIV  2 SAB 2018          1 SAB 2015             I spent 20 minutes reviewing  the patients chart, including labs and images as well as counseling the patient about her medical conditions. Greater than 50% of the time was spent in direct face-to-face patient counseling.  [name]  MFM, [facility]   03/07/2023  3:28 PM

## 2023-03-08 ENCOUNTER — Other Ambulatory Visit: Payer: Self-pay

## 2023-03-08 DIAGNOSIS — O24415 Gestational diabetes mellitus in pregnancy, controlled by oral hypoglycemic drugs: Secondary | ICD-10-CM

## 2023-03-15 ENCOUNTER — Ambulatory Visit: Payer: 59 | Admitting: *Deleted

## 2023-03-15 ENCOUNTER — Ambulatory Visit: Payer: 59 | Attending: Obstetrics and Gynecology

## 2023-03-15 VITALS — BP 147/87 | HR 103

## 2023-03-15 VITALS — BP 128/73

## 2023-03-15 DIAGNOSIS — O99212 Obesity complicating pregnancy, second trimester: Secondary | ICD-10-CM

## 2023-03-15 DIAGNOSIS — Z8632 Personal history of gestational diabetes: Secondary | ICD-10-CM | POA: Diagnosis present

## 2023-03-15 DIAGNOSIS — O10912 Unspecified pre-existing hypertension complicating pregnancy, second trimester: Secondary | ICD-10-CM

## 2023-03-15 DIAGNOSIS — O0990 Supervision of high risk pregnancy, unspecified, unspecified trimester: Secondary | ICD-10-CM

## 2023-03-15 DIAGNOSIS — Z3A35 35 weeks gestation of pregnancy: Secondary | ICD-10-CM

## 2023-03-15 DIAGNOSIS — O09299 Supervision of pregnancy with other poor reproductive or obstetric history, unspecified trimester: Secondary | ICD-10-CM | POA: Diagnosis present

## 2023-03-15 DIAGNOSIS — O133 Gestational [pregnancy-induced] hypertension without significant proteinuria, third trimester: Secondary | ICD-10-CM

## 2023-03-15 DIAGNOSIS — O99213 Obesity complicating pregnancy, third trimester: Secondary | ICD-10-CM | POA: Diagnosis not present

## 2023-03-15 DIAGNOSIS — O09293 Supervision of pregnancy with other poor reproductive or obstetric history, third trimester: Secondary | ICD-10-CM | POA: Diagnosis not present

## 2023-03-15 DIAGNOSIS — E669 Obesity, unspecified: Secondary | ICD-10-CM | POA: Diagnosis not present

## 2023-03-15 DIAGNOSIS — O34219 Maternal care for unspecified type scar from previous cesarean delivery: Secondary | ICD-10-CM

## 2023-03-15 DIAGNOSIS — O24415 Gestational diabetes mellitus in pregnancy, controlled by oral hypoglycemic drugs: Secondary | ICD-10-CM | POA: Diagnosis present

## 2023-03-15 DIAGNOSIS — O3660X0 Maternal care for excessive fetal growth, unspecified trimester, not applicable or unspecified: Secondary | ICD-10-CM

## 2023-03-17 NOTE — Progress Notes (Unsigned)
   PRENATAL VISIT NOTE  Subjective:  Lisa Ritter is a 33 y.o. G4P1021 at [redacted]w[redacted]d being seen today for ongoing prenatal care.  She is currently monitored for the following issues for this {Blank single:19197::"high-risk","low-risk"} pregnancy and has History of miscarriage, currently pregnant; History of gestational diabetes; Supervision of high risk pregnancy, antepartum; Gestational diabetes mellitus (GDM) controlled on oral hypoglycemic drug, antepartum; Previous cesarean delivery affecting pregnancy, antepartum; Elevated blood pressure reading; and Poor fetal growth affecting management of mother on their problem list.  Patient reports {sx:14538}.   .  .   . Denies leaking of fluid.   The following portions of the patient's history were reviewed and updated as appropriate: allergies, current medications, past family history, past medical history, past social history, past surgical history and problem list.   Objective:  There were no vitals filed for this visit.  Fetal Status:           General:  Alert, oriented and cooperative. Patient is in no acute distress.  Skin: Skin is warm and dry. No rash noted.   Cardiovascular: Normal heart rate noted  Respiratory: Normal respiratory effort, no problems with respiration noted  Abdomen: Soft, gravid, appropriate for gestational age.        Pelvic: {Blank single:19197::"Cervical exam performed in the presence of a chaperone","Cervical exam deferred"}        Extremities: Normal range of motion.     Mental Status: Normal mood and affect. Normal behavior. Normal judgment and thought content.   Assessment and Plan:  Pregnancy: G4P1021 at [redacted]w[redacted]d  1. Supervision of high risk pregnancy, antepartum (Primary) Patient doing well, feeling regular fetal movement BP, FHR, FH appropriate  2. [redacted] weeks gestation of pregnancy Anticipatory guidance about next visits/weeks of pregnancy given.   3. Gestational diabetes mellitus (GDM) controlled on oral  hypoglycemic drug, antepartum Compliant with glyburide   4. Previous cesarean delivery affecting pregnancy, antepartum VBAC consent signed  5. Elevated blood pressure reading BP ***  Preterm labor symptoms and general obstetric precautions including but not limited to vaginal bleeding, contractions, leaking of fluid and fetal movement were reviewed in detail with the patient.  Please refer to After Visit Summary for other counseling recommendations.   No follow-ups on file.  Future Appointments  Date Time Provider Department Center  03/20/2023 11:15 AM Ralene Muskrat, PA-C [facility] None  03/21/2023  3:00 PM [facility] NURSE [facility]  03/21/2023  3:15 PM [facility]ST [facility]  03/28/2023  3:00 PM [facility] NURSE [facility]  03/28/2023  3:15 PM [facility]  04/04/2023  2:15 PM [facility]URSE [facility]  04/04/2023  2:30 PM [facility] US5 [facility]    Ralene Muskrat, PA-C

## 2023-03-20 ENCOUNTER — Ambulatory Visit (INDEPENDENT_AMBULATORY_CARE_PROVIDER_SITE_OTHER): Payer: 59 | Admitting: Physician Assistant

## 2023-03-20 ENCOUNTER — Other Ambulatory Visit (HOSPITAL_COMMUNITY)
Admission: RE | Admit: 2023-03-20 | Discharge: 2023-03-20 | Disposition: A | Discharge status: Home / Self Care | Source: Ambulatory Visit | Attending: Physician Assistant | Admitting: Physician Assistant

## 2023-03-20 ENCOUNTER — Encounter: Payer: Self-pay | Admitting: Physician Assistant

## 2023-03-20 VITALS — BP 115/70 | HR 96 | Wt 198.8 lb

## 2023-03-20 DIAGNOSIS — O24415 Gestational diabetes mellitus in pregnancy, controlled by oral hypoglycemic drugs: Secondary | ICD-10-CM | POA: Diagnosis not present

## 2023-03-20 DIAGNOSIS — O34219 Maternal care for unspecified type scar from previous cesarean delivery: Secondary | ICD-10-CM | POA: Diagnosis not present

## 2023-03-20 DIAGNOSIS — O0990 Supervision of high risk pregnancy, unspecified, unspecified trimester: Secondary | ICD-10-CM | POA: Diagnosis not present

## 2023-03-20 DIAGNOSIS — Z3A36 36 weeks gestation of pregnancy: Secondary | ICD-10-CM | POA: Diagnosis not present

## 2023-03-20 DIAGNOSIS — Z112 Encounter for screening for other bacterial diseases: Secondary | ICD-10-CM | POA: Insufficient documentation

## 2023-03-20 DIAGNOSIS — O10919 Unspecified pre-existing hypertension complicating pregnancy, unspecified trimester: Secondary | ICD-10-CM

## 2023-03-20 DIAGNOSIS — R030 Elevated blood-pressure reading, without diagnosis of hypertension: Secondary | ICD-10-CM

## 2023-03-20 NOTE — Progress Notes (Signed)
 Pt presents for rob. Pt  is due for 36 wk labs. Pt has no questions or concerns at this time.

## 2023-03-21 ENCOUNTER — Other Ambulatory Visit: Payer: Self-pay | Admitting: Physician Assistant

## 2023-03-21 ENCOUNTER — Ambulatory Visit: Payer: 59 | Attending: Obstetrics and Gynecology | Admitting: *Deleted

## 2023-03-21 ENCOUNTER — Other Ambulatory Visit: Payer: Self-pay

## 2023-03-21 ENCOUNTER — Ambulatory Visit (HOSPITAL_BASED_OUTPATIENT_CLINIC_OR_DEPARTMENT_OTHER): Payer: 59

## 2023-03-21 VITALS — BP 129/61 | HR 75

## 2023-03-21 VITALS — BP 143/77 | HR 78

## 2023-03-21 DIAGNOSIS — O24415 Gestational diabetes mellitus in pregnancy, controlled by oral hypoglycemic drugs: Secondary | ICD-10-CM | POA: Diagnosis not present

## 2023-03-21 DIAGNOSIS — Z3A36 36 weeks gestation of pregnancy: Secondary | ICD-10-CM | POA: Insufficient documentation

## 2023-03-21 DIAGNOSIS — O24419 Gestational diabetes mellitus in pregnancy, unspecified control: Secondary | ICD-10-CM | POA: Diagnosis present

## 2023-03-21 DIAGNOSIS — O365990 Maternal care for other known or suspected poor fetal growth, unspecified trimester, not applicable or unspecified: Secondary | ICD-10-CM

## 2023-03-21 DIAGNOSIS — O0990 Supervision of high risk pregnancy, unspecified, unspecified trimester: Secondary | ICD-10-CM

## 2023-03-21 DIAGNOSIS — B9689 Other specified bacterial agents as the cause of diseases classified elsewhere: Secondary | ICD-10-CM

## 2023-03-21 LAB — CERVICOVAGINAL ANCILLARY ONLY
Bacterial Vaginitis (gardnerella): POSITIVE — AB
Candida Glabrata: NEGATIVE
Candida Vaginitis: NEGATIVE
Chlamydia: NEGATIVE
Comment: NEGATIVE
Comment: NEGATIVE
Comment: NEGATIVE
Comment: NEGATIVE
Comment: NEGATIVE
Comment: NORMAL
Neisseria Gonorrhea: NEGATIVE
Trichomonas: NEGATIVE

## 2023-03-21 MED ORDER — METRONIDAZOLE 500 MG PO TABS: 500.0000 mg | ORAL_TABLET | Freq: Two times a day (BID) | ORAL | 0 refills | Status: AC

## 2023-03-21 NOTE — Procedures (Signed)
 Lisa Ritter 06/13/1990 [redacted]w[redacted]d  Fetus A Non-Stress Test Interpretation for 03/21/23  Indication: Gestational Diabetes medication controlled  Fetal Heart Rate A Mode: External Baseline Rate (A): 140 bpm Variability: Moderate Accelerations: 15 x 15 Decelerations: None Multiple birth?: No  Uterine Activity Mode: Toco, Palpation Contraction Frequency (min): One ctx noted - pt did not feel Resting Tone Palpated: Relaxed  Interpretation (Fetal Testing) Nonstress Test Interpretation: Reactive Comments: Reviewed with Dr. Parke Poisson

## 2023-03-24 LAB — CULTURE, BETA STREP (GROUP B ONLY): Strep Gp B Culture: NEGATIVE

## 2023-03-25 ENCOUNTER — Encounter: Payer: Self-pay | Admitting: Physician Assistant

## 2023-03-26 NOTE — Progress Notes (Unsigned)
   PRENATAL VISIT NOTE  Subjective:  Lisa Ritter is a 33 y.o. G4P1021 at [redacted]w[redacted]d being seen today for ongoing prenatal care.  She is currently monitored for the following issues for this {Blank single:19197::"high-risk","low-risk"} pregnancy and has History of miscarriage, currently pregnant; History of gestational diabetes; Supervision of high risk pregnancy, antepartum; Gestational diabetes mellitus (GDM) controlled on oral hypoglycemic drug, antepartum; Previous cesarean delivery affecting pregnancy, antepartum; Elevated blood pressure reading; and Poor fetal growth affecting management of mother on their problem list.  Patient reports {sx:14538}.   .  .   . Denies leaking of fluid.   The following portions of the patient's history were reviewed and updated as appropriate: allergies, current medications, past family history, past medical history, past social history, past surgical history and problem list.   Objective:  There were no vitals filed for this visit.  Fetal Status:           General:  Alert, oriented and cooperative. Patient is in no acute distress.  Skin: Skin is warm and dry. No rash noted.   Cardiovascular: Normal heart rate noted  Respiratory: Normal respiratory effort, no problems with respiration noted  Abdomen: Soft, gravid, appropriate for gestational age.        Pelvic: Cervical exam deferred        Extremities: Normal range of motion.     Mental Status: Normal mood and affect. Normal behavior. Normal judgment and thought content.   Assessment and Plan:  Pregnancy: G4P1021 at [redacted]w[redacted]d  1. Supervision of high risk pregnancy, antepartum (Primary) Patient is doing well, feeling regular fetal movement BP, FHR, FH appropriate  2. [redacted] weeks gestation of pregnancy Anticipatory guidance about next visits/weeks of pregnancy given.   3. Gestational diabetes mellitus (GDM) controlled on oral hypoglycemic drug, antepartum On glyburide FBS:  PPBS:   4. Chronic  hypertension affecting pregnancy Normotensive, no current meds Normal baseline labs Growth [place] scheduled*** Delivery at 39 weeks Continue ASA Ssxs preE reviewed    5. Previous cesarean delivery affecting pregnancy, antepartum VBAC consent signed and available in chart    Term labor symptoms and general obstetric precautions including but not limited to vaginal bleeding, contractions, leaking of fluid and fetal movement were reviewed in detail with the patient.  Please refer to After Visit Summary for other counseling recommendations.   No follow-ups on file.  Future Appointments  Date Time Provider Department Center  03/27/2023  8:55 AM Ralene Muskrat, PA-C [facility] None  03/28/2023  3:00 PM [facility]URSE [facility]  03/28/2023  3:15 PM [facility]  04/04/2023  2:15 PM [facility]SE [facility]  04/04/2023  2:30 PM [facility] US5 [facility]    Ralene Muskrat, PA-C

## 2023-03-27 ENCOUNTER — Encounter: Payer: Self-pay | Admitting: Physician Assistant

## 2023-03-27 ENCOUNTER — Ambulatory Visit: Admitting: Physician Assistant

## 2023-03-27 ENCOUNTER — Encounter: Admitting: Obstetrics & Gynecology

## 2023-03-27 VITALS — BP 136/79 | HR 87 | Wt 201.1 lb

## 2023-03-27 DIAGNOSIS — O10919 Unspecified pre-existing hypertension complicating pregnancy, unspecified trimester: Secondary | ICD-10-CM

## 2023-03-27 DIAGNOSIS — Z3A37 37 weeks gestation of pregnancy: Secondary | ICD-10-CM

## 2023-03-27 DIAGNOSIS — O0990 Supervision of high risk pregnancy, unspecified, unspecified trimester: Secondary | ICD-10-CM | POA: Diagnosis not present

## 2023-03-27 DIAGNOSIS — O34219 Maternal care for unspecified type scar from previous cesarean delivery: Secondary | ICD-10-CM | POA: Diagnosis not present

## 2023-03-27 DIAGNOSIS — O24415 Gestational diabetes mellitus in pregnancy, controlled by oral hypoglycemic drugs: Secondary | ICD-10-CM

## 2023-03-27 NOTE — Progress Notes (Unsigned)
 Pt presents for rob. Pt has no questions or concerns at this time.

## 2023-03-28 ENCOUNTER — Ambulatory Visit: Payer: 59 | Attending: Obstetrics and Gynecology | Admitting: *Deleted

## 2023-03-28 ENCOUNTER — Ambulatory Visit (HOSPITAL_BASED_OUTPATIENT_CLINIC_OR_DEPARTMENT_OTHER): Payer: 59

## 2023-03-28 VITALS — BP 140/78 | HR 86

## 2023-03-28 VITALS — BP 131/69

## 2023-03-28 DIAGNOSIS — Z3A37 37 weeks gestation of pregnancy: Secondary | ICD-10-CM | POA: Insufficient documentation

## 2023-03-28 DIAGNOSIS — O24415 Gestational diabetes mellitus in pregnancy, controlled by oral hypoglycemic drugs: Secondary | ICD-10-CM | POA: Diagnosis not present

## 2023-03-28 DIAGNOSIS — O0990 Supervision of high risk pregnancy, unspecified, unspecified trimester: Secondary | ICD-10-CM

## 2023-03-28 DIAGNOSIS — O365990 Maternal care for other known or suspected poor fetal growth, unspecified trimester, not applicable or unspecified: Secondary | ICD-10-CM

## 2023-03-28 DIAGNOSIS — O365930 Maternal care for other known or suspected poor fetal growth, third trimester, not applicable or unspecified: Secondary | ICD-10-CM | POA: Diagnosis not present

## 2023-03-28 DIAGNOSIS — Z7984 Long term (current) use of oral hypoglycemic drugs: Secondary | ICD-10-CM | POA: Diagnosis not present

## 2023-03-28 DIAGNOSIS — O24414 Gestational diabetes mellitus in pregnancy, insulin controlled: Secondary | ICD-10-CM | POA: Diagnosis present

## 2023-03-28 MED ORDER — GLYBURIDE 2.5 MG PO TABS
2.5000 mg | ORAL_TABLET | Freq: Two times a day (BID) | ORAL | 3 refills | Status: DC
Start: 2023-03-28 — End: 2023-04-12

## 2023-03-28 NOTE — Procedures (Addendum)
 Lisa Ritter January 08, 1991 [redacted]w[redacted]d  Fetus A Non-Stress Test Interpretation for 03/28/23  Indication: Gestational Diabetes (Insulin controlled)   Fetal Heart Rate A Mode: External Baseline Rate (A): 150 bpm Variability: Moderate Accelerations: 15 x 15 Decelerations: None Multiple birth?: No  Uterine Activity Mode: Toco, Palpation Contraction Frequency (min): None Resting Tone Palpated: Relaxed  Interpretation (Fetal Testing) Nonstress Test Interpretation: Reactive Comments: Reviewed with Dr. Judeth Cornfield  MFM Note Modified BPP Patient is here for NST monitoring. NST is reactive. She has gestational diabetes and takes metformin for control.  Patient reports her blood glucose levels are within normal range. I performed a bedside ultrasound. Amniotic fluid is normal and good fetal heart activity is seen. AFI is 7 cm. Cephalic presentation. I reassured the patient of the findings. Reassuring Modified BPP is associated with a very low likelihood of stillbirth (0.8 per 1,000 births in one week).

## 2023-03-29 ENCOUNTER — Encounter: Payer: Self-pay | Admitting: Physician Assistant

## 2023-03-31 NOTE — Progress Notes (Unsigned)
   PRENATAL VISIT NOTE  Subjective:  Lisa Ritter is a 33 y.o. G4P1021 at [redacted]w[redacted]d being seen today for ongoing prenatal care.  She is currently monitored for the following issues for this {Blank single:19197::"high-risk","low-risk"} pregnancy and has History of miscarriage, currently pregnant; History of gestational diabetes; Supervision of high risk pregnancy, antepartum; Gestational diabetes mellitus (GDM) controlled on oral hypoglycemic drug, antepartum; Previous cesarean delivery affecting pregnancy, antepartum; Elevated blood pressure reading; and Poor fetal growth affecting management of mother on their problem list.  Patient reports {sx:14538}.   .  .   . Denies leaking of fluid.   The following portions of the patient's history were reviewed and updated as appropriate: allergies, current medications, past family history, past medical history, past social history, past surgical history and problem list.   Objective:  There were no vitals filed for this visit.  Fetal Status:           General:  Alert, oriented and cooperative. Patient is in no acute distress.  Skin: Skin is warm and dry. No rash noted.   Cardiovascular: Normal heart rate noted  Respiratory: Normal respiratory effort, no problems with respiration noted  Abdomen: Soft, gravid, appropriate for gestational age.        Pelvic: Cervical exam deferred        Extremities: Normal range of motion.     Mental Status: Normal mood and affect. Normal behavior. Normal judgment and thought content.   Assessment and Plan:  Pregnancy: G4P1021 at [redacted]w[redacted]d  1. Supervision of high risk pregnancy, antepartum (Primary) Patient is doing well, feeling regular fetal movement BP, FHR, FH appropriate   2. [redacted] weeks gestation of pregnancy Anticipatory guidance about next visits/weeks of pregnancy given.   3. Gestational diabetes mellitus (GDM) controlled on oral hypoglycemic drug, antepartum On glyburide 2.5 mg BID  FBS:  PPBS:   4.  Chronic hypertension affecting pregnancy Continue ASA Normotensive, no current meds Normal baseline labs Growth [place] scheduled*** Delivery at 39 weeks- schedule *** Ssxs preE reviewed   5. Previous cesarean delivery affecting pregnancy, antepartum VBAC consent signed 03/06/23   Term labor symptoms and general obstetric precautions including but not limited to vaginal bleeding, contractions, leaking of fluid and fetal movement were reviewed in detail with the patient.  Please refer to After Visit Summary for other counseling recommendations.   No follow-ups on file.  Future Appointments  Date Time Provider Department Center  04/03/2023  9:55 AM Ralene Muskrat, [place] [facility] None  04/04/2023  2:15 PM [facility] NURSE [facility]  04/04/2023  2:30 PM [facility] US5 [facility]    Ralene Muskrat, PA-C

## 2023-04-03 ENCOUNTER — Encounter: Payer: Self-pay | Admitting: Physician Assistant

## 2023-04-03 ENCOUNTER — Ambulatory Visit (INDEPENDENT_AMBULATORY_CARE_PROVIDER_SITE_OTHER): Admitting: Physician Assistant

## 2023-04-03 VITALS — BP 124/80 | HR 93 | Wt 201.8 lb

## 2023-04-03 DIAGNOSIS — O0990 Supervision of high risk pregnancy, unspecified, unspecified trimester: Secondary | ICD-10-CM | POA: Diagnosis not present

## 2023-04-03 DIAGNOSIS — O24415 Gestational diabetes mellitus in pregnancy, controlled by oral hypoglycemic drugs: Secondary | ICD-10-CM | POA: Diagnosis not present

## 2023-04-03 DIAGNOSIS — O10919 Unspecified pre-existing hypertension complicating pregnancy, unspecified trimester: Secondary | ICD-10-CM | POA: Diagnosis not present

## 2023-04-03 DIAGNOSIS — O34219 Maternal care for unspecified type scar from previous cesarean delivery: Secondary | ICD-10-CM

## 2023-04-03 DIAGNOSIS — Z3A38 38 weeks gestation of pregnancy: Secondary | ICD-10-CM

## 2023-04-03 NOTE — Progress Notes (Signed)
 Pt presents for rob. Pt has no questions or concerns at this time.

## 2023-04-04 ENCOUNTER — Telehealth (HOSPITAL_COMMUNITY): Payer: Self-pay | Admitting: *Deleted

## 2023-04-04 ENCOUNTER — Ambulatory Visit: Payer: 59

## 2023-04-04 ENCOUNTER — Ambulatory Visit: Payer: 59 | Attending: Obstetrics and Gynecology | Admitting: *Deleted

## 2023-04-04 ENCOUNTER — Encounter (HOSPITAL_COMMUNITY): Payer: Self-pay | Admitting: *Deleted

## 2023-04-04 VITALS — BP 146/76

## 2023-04-04 DIAGNOSIS — Z3A38 38 weeks gestation of pregnancy: Secondary | ICD-10-CM

## 2023-04-04 DIAGNOSIS — O26893 Other specified pregnancy related conditions, third trimester: Secondary | ICD-10-CM | POA: Diagnosis not present

## 2023-04-04 DIAGNOSIS — E669 Obesity, unspecified: Secondary | ICD-10-CM | POA: Diagnosis not present

## 2023-04-04 DIAGNOSIS — O09293 Supervision of pregnancy with other poor reproductive or obstetric history, third trimester: Secondary | ICD-10-CM | POA: Diagnosis not present

## 2023-04-04 DIAGNOSIS — R030 Elevated blood-pressure reading, without diagnosis of hypertension: Secondary | ICD-10-CM | POA: Insufficient documentation

## 2023-04-04 DIAGNOSIS — O24415 Gestational diabetes mellitus in pregnancy, controlled by oral hypoglycemic drugs: Secondary | ICD-10-CM | POA: Insufficient documentation

## 2023-04-04 DIAGNOSIS — O133 Gestational [pregnancy-induced] hypertension without significant proteinuria, third trimester: Secondary | ICD-10-CM

## 2023-04-04 DIAGNOSIS — O99213 Obesity complicating pregnancy, third trimester: Secondary | ICD-10-CM

## 2023-04-04 DIAGNOSIS — O0990 Supervision of high risk pregnancy, unspecified, unspecified trimester: Secondary | ICD-10-CM

## 2023-04-04 NOTE — Telephone Encounter (Signed)
 Preadmission screen

## 2023-04-08 ENCOUNTER — Inpatient Hospital Stay (HOSPITAL_COMMUNITY): Admitting: Anesthesiology

## 2023-04-08 ENCOUNTER — Encounter (HOSPITAL_COMMUNITY): Payer: Self-pay | Admitting: Obstetrics and Gynecology

## 2023-04-08 ENCOUNTER — Inpatient Hospital Stay (HOSPITAL_COMMUNITY)

## 2023-04-08 ENCOUNTER — Other Ambulatory Visit: Payer: Self-pay

## 2023-04-08 ENCOUNTER — Inpatient Hospital Stay (HOSPITAL_COMMUNITY)
Admission: RE | Admit: 2023-04-08 | Discharge: 2023-04-12 | DRG: 788 | Disposition: A | Discharge status: Home / Self Care | Payer: 59 | Attending: Obstetrics and Gynecology | Admitting: Obstetrics and Gynecology

## 2023-04-08 DIAGNOSIS — O1410 Severe pre-eclampsia, unspecified trimester: Secondary | ICD-10-CM | POA: Diagnosis not present

## 2023-04-08 DIAGNOSIS — O1404 Mild to moderate pre-eclampsia, complicating childbirth: Secondary | ICD-10-CM | POA: Diagnosis not present

## 2023-04-08 DIAGNOSIS — Z833 Family history of diabetes mellitus: Secondary | ICD-10-CM | POA: Diagnosis not present

## 2023-04-08 DIAGNOSIS — O99214 Obesity complicating childbirth: Secondary | ICD-10-CM | POA: Diagnosis present

## 2023-04-08 DIAGNOSIS — Z98891 History of uterine scar from previous surgery: Principal | ICD-10-CM

## 2023-04-08 DIAGNOSIS — Z8249 Family history of ischemic heart disease and other diseases of the circulatory system: Secondary | ICD-10-CM

## 2023-04-08 DIAGNOSIS — O24415 Gestational diabetes mellitus in pregnancy, controlled by oral hypoglycemic drugs: Secondary | ICD-10-CM | POA: Diagnosis present

## 2023-04-08 DIAGNOSIS — Z7982 Long term (current) use of aspirin: Secondary | ICD-10-CM

## 2023-04-08 DIAGNOSIS — N179 Acute kidney failure, unspecified: Secondary | ICD-10-CM | POA: Diagnosis not present

## 2023-04-08 DIAGNOSIS — Z880 Allergy status to penicillin: Secondary | ICD-10-CM

## 2023-04-08 DIAGNOSIS — E119 Type 2 diabetes mellitus without complications: Secondary | ICD-10-CM | POA: Diagnosis present

## 2023-04-08 DIAGNOSIS — Z7984 Long term (current) use of oral hypoglycemic drugs: Secondary | ICD-10-CM

## 2023-04-08 DIAGNOSIS — O0990 Supervision of high risk pregnancy, unspecified, unspecified trimester: Secondary | ICD-10-CM

## 2023-04-08 DIAGNOSIS — O34211 Maternal care for low transverse scar from previous cesarean delivery: Secondary | ICD-10-CM | POA: Diagnosis present

## 2023-04-08 DIAGNOSIS — O24424 Gestational diabetes mellitus in childbirth, insulin controlled: Secondary | ICD-10-CM | POA: Diagnosis not present

## 2023-04-08 DIAGNOSIS — O2412 Pre-existing diabetes mellitus, type 2, in childbirth: Secondary | ICD-10-CM | POA: Diagnosis present

## 2023-04-08 DIAGNOSIS — O114 Pre-existing hypertension with pre-eclampsia, complicating childbirth: Principal | ICD-10-CM | POA: Diagnosis present

## 2023-04-08 DIAGNOSIS — O365930 Maternal care for other known or suspected poor fetal growth, third trimester, not applicable or unspecified: Secondary | ICD-10-CM | POA: Diagnosis not present

## 2023-04-08 DIAGNOSIS — O1092 Unspecified pre-existing hypertension complicating childbirth: Secondary | ICD-10-CM | POA: Diagnosis present

## 2023-04-08 DIAGNOSIS — Z3A39 39 weeks gestation of pregnancy: Secondary | ICD-10-CM

## 2023-04-08 DIAGNOSIS — O1400 Mild to moderate pre-eclampsia, unspecified trimester: Secondary | ICD-10-CM | POA: Diagnosis not present

## 2023-04-08 DIAGNOSIS — O24419 Gestational diabetes mellitus in pregnancy, unspecified control: Principal | ICD-10-CM | POA: Diagnosis present

## 2023-04-08 LAB — PROTEIN / CREATININE RATIO, URINE
Creatinine, Urine: 64 mg/dL
Protein Creatinine Ratio: 0.3 mg/mg{creat} — ABNORMAL HIGH (ref 0.00–0.15)
Total Protein, Urine: 19 mg/dL

## 2023-04-08 LAB — COMPREHENSIVE METABOLIC PANEL WITH GFR
ALT: 18 U/L (ref 0–44)
AST: 25 U/L (ref 15–41)
Albumin: 2.6 g/dL — ABNORMAL LOW (ref 3.5–5.0)
Alkaline Phosphatase: 232 U/L — ABNORMAL HIGH (ref 38–126)
Anion gap: 11 (ref 5–15)
BUN: 11 mg/dL (ref 6–20)
CO2: 18 mmol/L — ABNORMAL LOW (ref 22–32)
Calcium: 9 mg/dL (ref 8.9–10.3)
Chloride: 104 mmol/L (ref 98–111)
Creatinine, Ser: 0.61 mg/dL (ref 0.44–1.00)
GFR, Estimated: >60 mL/min (ref >60)
Glucose, Bld: 131 mg/dL — ABNORMAL HIGH (ref 70–99)
Potassium: 3.9 mmol/L (ref 3.5–5.1)
Sodium: 133 mmol/L — ABNORMAL LOW (ref 135–145)
Total Bilirubin: 0.3 mg/dL (ref 0.0–1.2)
Total Protein: 6 g/dL — ABNORMAL LOW (ref 6.5–8.1)

## 2023-04-08 LAB — CBC
HCT: 36 % (ref 36.0–46.0)
HCT: 36 % (ref 36.0–46.0)
Hemoglobin: 12.7 g/dL (ref 12.0–15.0)
Hemoglobin: 12.5 g/dL (ref 12.0–15.0)
MCH: 28.8 pg (ref 26.0–34.0)
MCH: 29.1 pg (ref 26.0–34.0)
MCHC: 35.3 g/dL (ref 30.0–36.0)
MCHC: 34.7 g/dL (ref 30.0–36.0)
MCV: 81.6 fL (ref 80.0–100.0)
MCV: 83.9 fL (ref 80.0–100.0)
Platelets: 272 10*3/uL (ref 150–400)
Platelets: 239 10*3/uL (ref 150–400)
RBC: 4.41 MIL/uL (ref 3.87–5.11)
RBC: 4.29 MIL/uL (ref 3.87–5.11)
RDW: 12.8 % (ref 11.5–15.5)
RDW: 13 % (ref 11.5–15.5)
WBC: 12.1 10*3/uL — ABNORMAL HIGH (ref 4.0–10.5)
WBC: 16.7 10*3/uL — ABNORMAL HIGH (ref 4.0–10.5)
nRBC: 0 % (ref 0.0–0.2)
nRBC: 0 % (ref 0.0–0.2)

## 2023-04-08 LAB — TYPE AND SCREEN
ABO/RH(D): O POS
Antibody Screen: NEGATIVE

## 2023-04-08 LAB — GLUCOSE, CAPILLARY
Glucose-Capillary: 156 mg/dL — ABNORMAL HIGH (ref 70–99)
Glucose-Capillary: 100 mg/dL — ABNORMAL HIGH (ref 70–99)
Glucose-Capillary: 69 mg/dL — ABNORMAL LOW (ref 70–99)
Glucose-Capillary: 65 mg/dL — ABNORMAL LOW (ref 70–99)
Glucose-Capillary: 69 mg/dL — ABNORMAL LOW (ref 70–99)
Glucose-Capillary: 73 mg/dL (ref 70–99)
Glucose-Capillary: 76 mg/dL (ref 70–99)

## 2023-04-08 LAB — RPR: RPR Ser Ql: NONREACTIVE

## 2023-04-08 MED ORDER — LIDOCAINE HCL (PF) 1 % IJ SOLN
INTRAMUSCULAR | Status: DC | PRN
  Administered 2023-04-08: 5 mL via EPIDURAL
  Administered 2023-04-08: 2 mL via EPIDURAL
  Administered 2023-04-08: 3 mL via EPIDURAL

## 2023-04-08 MED ORDER — FENTANYL-BUPIVACAINE-NACL 0.5-0.125-0.9 MG/250ML-% EP SOLN
12.0000 mL/h | EPIDURAL | Status: DC | PRN
  Administered 2023-04-08: 12 mL/h via EPIDURAL
  Filled 2023-04-08: qty 250

## 2023-04-08 MED ORDER — OXYTOCIN-SODIUM CHLORIDE 30-0.9 UT/500ML-% IV SOLN
1.0000 m[IU]/min | INTRAVENOUS | Status: DC
  Administered 2023-04-08: 4 m[IU]/min via INTRAVENOUS
  Administered 2023-04-08: 2 m[IU]/min via INTRAVENOUS
  Filled 2023-04-08: qty 500

## 2023-04-08 MED ORDER — EPHEDRINE 5 MG/ML INJ: 10.0000 mg | INTRAVENOUS | Status: DC | PRN

## 2023-04-08 MED ORDER — FLEET ENEMA RE ENEM: 1.0000 | ENEMA | RECTAL | Status: DC | PRN

## 2023-04-08 MED ORDER — HYDRALAZINE HCL 20 MG/ML IJ SOLN: 10.0000 mg | INTRAMUSCULAR | Status: DC | PRN

## 2023-04-08 MED ORDER — OXYCODONE-ACETAMINOPHEN 5-325 MG PO TABS: 2.0000 | ORAL_TABLET | ORAL | Status: DC | PRN

## 2023-04-08 MED ORDER — LABETALOL HCL 5 MG/ML IV SOLN: 20.0000 mg | INTRAVENOUS | Status: DC | PRN

## 2023-04-08 MED ORDER — LACTATED RINGERS IV SOLN: INTRAVENOUS | Status: AC

## 2023-04-08 MED ORDER — LACTATED RINGERS IV SOLN
500.0000 mL | INTRAVENOUS | Status: AC | PRN
  Administered 2023-04-08: 1000 mL via INTRAVENOUS
  Administered 2023-04-09: 500 mL via INTRAVENOUS

## 2023-04-08 MED ORDER — OXYTOCIN BOLUS FROM INFUSION: 333.0000 mL | Freq: Once | INTRAVENOUS | Status: DC

## 2023-04-08 MED ORDER — DIPHENHYDRAMINE HCL 50 MG/ML IJ SOLN: 12.5000 mg | INTRAMUSCULAR | Status: DC | PRN

## 2023-04-08 MED ORDER — LABETALOL HCL 5 MG/ML IV SOLN: 80.0000 mg | INTRAVENOUS | Status: DC | PRN

## 2023-04-08 MED ORDER — PHENYLEPHRINE 80 MCG/ML (10ML) SYRINGE FOR IV PUSH (FOR BLOOD PRESSURE SUPPORT): 80.0000 ug | PREFILLED_SYRINGE | INTRAVENOUS | Status: DC | PRN

## 2023-04-08 MED ORDER — SOD CITRATE-CITRIC ACID 500-334 MG/5ML PO SOLN: 30.0000 mL | ORAL | Status: DC | PRN

## 2023-04-08 MED ORDER — LACTATED RINGERS IV SOLN: 500.0000 mL | Freq: Once | INTRAVENOUS | Status: DC

## 2023-04-08 MED ORDER — LACTATED RINGERS IV SOLN
500.0000 mL | Freq: Once | INTRAVENOUS | Status: AC
  Administered 2023-04-08: 500 mL via INTRAVENOUS

## 2023-04-08 MED ORDER — LABETALOL HCL 5 MG/ML IV SOLN: 40.0000 mg | INTRAVENOUS | Status: DC | PRN

## 2023-04-08 MED ORDER — OXYCODONE-ACETAMINOPHEN 5-325 MG PO TABS: 1.0000 | ORAL_TABLET | ORAL | Status: DC | PRN

## 2023-04-08 MED ORDER — TERBUTALINE SULFATE 1 MG/ML IJ SOLN: 0.2500 mg | Freq: Once | INTRAMUSCULAR | Status: DC | PRN

## 2023-04-08 MED ORDER — OXYTOCIN-SODIUM CHLORIDE 30-0.9 UT/500ML-% IV SOLN: 2.5000 [IU]/h | INTRAVENOUS | Status: DC

## 2023-04-08 MED ORDER — LIDOCAINE HCL (PF) 1 % IJ SOLN: 30.0000 mL | INTRAMUSCULAR | Status: DC | PRN

## 2023-04-08 MED ORDER — ONDANSETRON HCL 4 MG/2ML IJ SOLN: 4.0000 mg | Freq: Four times a day (QID) | INTRAMUSCULAR | Status: DC | PRN

## 2023-04-08 MED ORDER — ACETAMINOPHEN 325 MG PO TABS: 650.0000 mg | ORAL_TABLET | ORAL | Status: DC | PRN

## 2023-04-08 NOTE — Progress Notes (Addendum)
 Labor Progress Note Lisa Ritter is a 33 y.o. [id number] at [redacted]w[redacted]d who presented for TOLAC IOL for cHTN and T2DM.  S: Patient reports she feels significantly better and more comfortable following epidural placement. Agreeable to IUPC insertion.  O:  BP (!) 151/74   Pulse 66   Temp 98.9 F (37.2 C) (Axillary)   Resp 20   Ht 5\' 2"  (1.575 m)   Wt 92.6 kg   LMP 07/09/2022 (Exact Date)   SpO2 100%   BMI 37.35 kg/m  FHT: 140bpm baseline, moderate variability although with a period of minimal variability, + accels, variable decel at 23:28 in setting of IUPC placement  CVE: Dilation: 3 Effacement (%): 60 Cervical Position: Posterior Station: -2 Presentation: Vertex Exam by:: Ned Grace, RN   A&P:   Lisa Ritter is a 44W [id number] at [redacted]w[redacted]d who presents for TOLAC IOL for cHTN and T2DM.   --IOL: Most recent SVE 4.5/70/-2 at 23:30 with IUPC placed at that time. Plan to continue pitocin (currently at 75mL/hr) with caution for tachysystole and titration to achieve adequate MVUs.          --AROMc at 15:07         --Epidural in place --FWB: Category II due to period of minimal variability and variable decel in setting of IUPC placement, but remains overall reassuring. Continue to monitor. --SIPE w/o SF: cHTN without home meds. Ruled in for SIPE on admit by UPC. Asymptomatic. Continue to monitor for development of SF.         --BP labs: UPC 0.3* // 272 // 25/18, Cr 0.61         --One SRBP at 19:27, although unclear if 2/2 pain. Continue to monitor.         --PRN labetalol and hydralazine         --Low threshold to start mag if recurrent SRBP --T2DM: Glyburide at home, holding given concern for hypoglycemia with limited PO intake intrapartum. Plan to monitor CBGs q4hr. --TOLAC: Hx pLTCS in 2022 called for NRFHR with recurrent variable decels and prolonged decels while on pitocin. Counseled and consented for [facility] 03/06/2023.   [name], MD 11:34 PM   Evaluation and management  procedures were performed by the [facility] Medicine Resident under my supervision. I was immediately available for direct supervision, assistance and direction throughout this encounter.  I also confirm that I have verified the information documented in the resident's note, and that I have also personally reperformed the pertinent components of the physical exam and all of the medical decision making activities.  I have also made any necessary editorial changes.   Mittie Bodo, MD Family Medicine - Obstetrics Fellow

## 2023-04-08 NOTE — Anesthesia Procedure Notes (Signed)
 Epidural Patient location during procedure: OB Start time: 04/08/2023 9:25 PM End time: 04/08/2023 9:32 PM  Staffing Anesthesiologist: Marcene Duos, MD Performed: anesthesiologist   Preanesthetic Checklist Completed: patient identified, IV checked, site marked, risks and benefits discussed, surgical consent, monitors and equipment checked, pre-op evaluation and timeout performed  Epidural Patient position: sitting Prep: DuraPrep and site prepped and draped Patient monitoring: continuous pulse ox and blood pressure Approach: midline Location: L4-L5 Injection technique: LOR air  Needle:  Needle type: Tuohy  Needle gauge: 17 G Needle length: 9 cm and 9 Needle insertion depth: 6 cm Catheter type: closed end flexible Catheter size: 19 Gauge Catheter at skin depth: 11 cm Test dose: negative  Assessment Events: blood not aspirated, no cerebrospinal fluid, injection not painful, no injection resistance, no paresthesia and negative IV test

## 2023-04-08 NOTE — Progress Notes (Addendum)
 Labor Progress Note Lisa Ritter is a 33 y.o. [id number] at [redacted]w[redacted]d who presented for TOLAC IOL for cHTN and T2DM.  S: Patient reports significantly increased discomfort, feeling contractions. Some pressure in bottom as well but improved after BM. Asymptomatic from BP standpoint. Wants epidural.  O:  BP (!) 160/83 (BP Location: Left Arm)   Pulse 71   Temp 97.9 F (36.6 C) (Axillary)   Resp 20   Ht 5\' 2"  (1.575 m)   Wt 92.6 kg   LMP 07/09/2022 (Exact Date)   SpO2 98%   BMI 37.35 kg/m  FHT: 130bpm baseline, moderate variability, + accels, no decels CTX: q1-75min on toco  CVE: Dilation: 3 Effacement (%): 60 Cervical Position: Posterior Station: -2 Presentation: Vertex Exam by:: Lisa Grace, RN   A&P:   Lisa Ritter is a 44W [id number] at [redacted]w[redacted]d who presents for TOLAC IOL for cHTN and T2DM.  --IOL: Most recent SVE 3/60/-2 at 18:00. Plan to continue with pitocin (currently at 56mL/hr) with caution for tachysystole. Plan IUPC after epidural given TOLAC and concerns with pitocin titration.          --AROMc at 15:07         --Requesting epidural --FWB: Category I, reassuring. Continue to monitor. --SIPE w/o SF: cHTN without home meds. Ruled in for SIPE on admit by UPC. Asymptomatic. Continue to monitor for development of SF.         --BP labs: UPC 0.3* // 272 // 25/18, Cr 0.61         --One SRBP at 19:27, although unclear if 2/2 pain. Continue to monitor.         --PRN labetalol and hydralazine, low threshold to start Mag if recurrent SRBP --T2DM: Glyburide at home, holding given concern for hypoglycemia with limited PO intake intrapartum. Plan to monitor CBGs q4hr. --TOLAC: Hx pLTCS in 2022 called for NRFHR with recurrent variable decels and prolonged decels while on pitocin. Counseled and consented for [facility] 03/06/2023.  Rh pos/GBS neg/boy(yes)/both/NFP   Lisa Solo, MD 8:51 PM  Evaluation and management procedures were performed by the [facility] Medicine Resident under my  supervision. I was immediately available for direct supervision, assistance and direction throughout this encounter.  I also confirm that I have verified the information documented in the resident's note, and that I have also personally reperformed the pertinent components of the physical exam and all of the medical decision making activities.  I have also made any necessary editorial changes.   Lisa Bodo, MD Family Medicine - Obstetrics Fellow

## 2023-04-08 NOTE — Progress Notes (Signed)
 Hypoglycemic Event  CBG: 69  Treatment: 4 oz juice/soda  Symptoms: None  Follow-up CBG: Time:1300 CBG Result: 100  Possible Reasons for Event: Inadequate meal intake  Comments/MD notified:Dr. Cheree Ditto notified in person @1245 . 4 oz juice given at 1245. CBG 100.    Lisa Ritter

## 2023-04-08 NOTE — H&P (Signed)
 OBSTETRIC ADMISSION HISTORY AND PHYSICAL  Lisa Ritter is a 33 y.o. female [phone] with IUP at [redacted]w[redacted]d by [place] presenting for TOLAC IOL for [facility] and T2DM. She reports +FMs, No LOF, no VB, no blurry vision, headaches or peripheral edema, and RUQ pain.  She plans on both breast and bottle feeding. She request NFP for birth control. She received her prenatal care at  [facility]    Dating: By [place] --->  Estimated Date of Delivery: 04/15/23  Sono:    @[redacted]w[redacted]d , CWD, normal anatomy, cephalic presentation, anterior placental lie, 3298g, 48% EFW   Prenatal History/Complications:  Patient Active Problem List   Diagnosis Date Noted   Gestational diabetes mellitus (GDM) 04/08/2023   Poor fetal growth affecting management of mother 02/22/2023   Previous cesarean delivery affecting pregnancy, antepartum 01/14/2023   Elevated blood pressure reading 01/14/2023   Gestational diabetes mellitus (GDM) controlled on oral hypoglycemic drug, antepartum 09/27/2022   Supervision of high risk pregnancy, antepartum 09/13/2022   History of gestational diabetes 08/24/2020   History of miscarriage, currently pregnant 04/07/2020   NURSING  PROVIDER  Office Location [facility] Dating by LMP c/w U/S at 9 wks  [facility] Model Traditional Anatomy U/S Normal anatomy  Initiated care at  [facility]  English              LAB RESULTS   Support Person  [facility] NIPS: LR AFP: Negative    NT/IT (FT only)     Carrier Screen Horizon: Neg  Rhogam  O/Positive/-- (09/05 1121) A1C/GTT Early: 5.5-- T2DM Third trimester: ^  Flu Vaccine Decline 01-23-23    TDaP Vaccine Decline 01-23-23 Blood Type O/Positive/-- (09/05 1121)  Covid Vaccine  No Antibody Negative (09/05 1121)    Rubella 2.37 (09/05 1121)  Feeding Plan both (breast and bottle w/breast) RPR Non Reactive (09/05 1121)  Contraception natural family planning (NFP) HBsAg Negative (09/05 1121)  Circumcision Yes if female HIV Non Reactive (09/05 1121)  Pediatrician    [facility] HCVAb Non Reactive (09/05 1121)  Prenatal Classes       Pap No results found for: "DIAGPAP"  BTLConsent  GC/CT Initial:  neg/neg 36wks:  neg/neg  VBAC  Consent 03/06/23 GBS   For PCN allergy, check sensitivities        DME Rx [X]  BP cuff [ ]  Weight Scale Waterbirth  [ ]  Class [ ]  Consent [ ]  CNM visit  PHQ9 & GAD7 [X]  new OB [ x ] 28 weeks  [  ] 36 weeks Induction  [ ]  Orders Entered [ ] Foley Y/N     Past Medical History: Past Medical History:  Diagnosis Date   Diabetes mellitus without complication (HCC)    GDM   Gestational diabetes    Hypertension    Pregnancy   Obesity affecting pregnancy, antepartum 04/07/2020   Recommendations  [x]  Aspirin 81 mg daily after 12 weeks; discontinue after 36 weeks  [ ]  Nutrition consult  [x]  Weight gain 11-20 lbs for singleton and 25-35 lbs for twin pregnancy (IOM guidelines)      Higher class of obesity patients recommended to gain closer to lower limit     Weight loss is associated with adverse outcomes  [ ]  Screen for DM with A1C or early 2 hr GTT  [x]  Baseline and survei    Past Surgical History: Past Surgical History:  Procedure Laterality Date   CESAREAN SECTION N/A 10/20/2020  Procedure: CESAREAN SECTION;  Surgeon: Hermina Staggers, MD;  Location: [facility] LD ORS;  Service: Obstetrics;  Laterality: N/A;    Obstetrical History: OB History     Gravida  4   Para  1   Term  1   Preterm  0   AB  2   Living  1      SAB  2   IAB  0   Ectopic  0   Multiple  0   Live Births  1           Social History Social History   Socioeconomic History   Marital status: Married    Spouse name: Sharlyne Pacas   Number of children: Not on file   Years of education: Not on file   Highest education level: High school graduate  Occupational History    Comment: Work from Home  Tobacco Use   Smoking status: Never   Smokeless tobacco: Never  Vaping Use   Vaping status: Never Used  Substance and Sexual Activity    Alcohol use: Never   Drug use: Never   Sexual activity: Not Currently    Birth control/protection: None  Other Topics Concern   Not on file  Social History Narrative   Not on file   Social Drivers of Health   Financial Resource Strain: Not on file  Food Insecurity: No Food Insecurity (09/09/2020)   Hunger Vital Sign    Worried About Running Out of Food in the Last Year: Never true    Ran Out of Food in the Last Year: Never true  Transportation Needs: Not on file  Physical Activity: Not on file  Stress: Not on file  Social Connections: Not on file    Family History: Family History  Problem Relation Age of Onset   Hypertension Mother    Stroke Father    Heart disease Father    Cancer Father    Diabetes Father    Asthma Neg Hx     Allergies: Allergies  Allergen Reactions   Penicillins Other (See Comments)    Unsure of reactions    Medications Prior to Admission  Medication Sig Dispense Refill Last Dose/Taking   Accu-Chek Softclix Lancets lancets 1 each by Other route 4 (four) times daily. 100 each 12    aspirin EC 81 MG tablet Take 81 mg by mouth daily. Swallow whole.      Cholecalciferol (VITAMIN D3) 50 MCG (2000 UT) TABS Take by mouth.      folic acid (FOLVITE) 400 MCG tablet Take 400 mcg by mouth daily.      glucose blood (ACCU-CHEK GUIDE) test strip Use to check blood sugars four times a day was instructed 100 each 12    glyBURIDE (DIABETA) 2.5 MG tablet Take 1 tablet (2.5 mg total) by mouth 2 (two) times daily with a meal. 60 tablet 3    magnesium gluconate (MAGONATE) 500 MG tablet Take 500 mg by mouth 2 (two) times daily.      Prenatal Vit-Fe Fumarate-FA (MULTIVITAMIN-PRENATAL) 27-0.8 MG TABS tablet Take 1 tablet by mouth daily at 12 noon.        Review of Systems   All systems reviewed and negative except as stated in HPI  Last menstrual period 07/09/2022. General appearance: alert, cooperative, and appears stated age Lungs: clear to auscultation  bilaterally Heart: regular rate and rhythm Abdomen: soft, non-tender; bowel sounds normal Pelvic: normal female genitalia  Extremities: Homans sign is negative, no sign of DVT Presentation:  cephalic confirmed on BSUS  Fetal monitoringBaseline: 150 bpm, Variability: Good {> 6 bpm), Accelerations: Reactive, and Decelerations: Absent Uterine activity q72min     Prenatal labs: ABO, Rh: O/Positive/-- (09/05 1121) Antibody: Negative (09/05 1121) Rubella: 2.37 (09/05 1121) RPR: Non Reactive (01/15 1203)  HBsAg: Negative (09/05 1121)  HIV: Non Reactive (01/15 1203)  GBS: Negative/-- (03/12 1320)    Lab Results  Component Value Date   GBS Negative 03/20/2023   GTT T2DM, A1c 5.5 Genetic screening  LR NIPS, AFP neg, Horizon neg  Anatomy US nrl  There is no immunization history for the selected administration types on file for this patient.  Prenatal Transfer Tool  Maternal Diabetes: Yes:  Diabetes Type:  Pre-pregnancy Genetic Screening: Normal Maternal Ultrasounds/Referrals: Normal Fetal Ultrasounds or other Referrals:  Referred to Materal Fetal Medicine  Maternal Substance Abuse:  No Significant Maternal Medications:  Meds include: Other:  Glyburide Significant Maternal Lab Results: Group B Strep negative Number of Prenatal Visits:greater than 3 verified prenatal visits Maternal Vaccinations:declined Other Comments:  None   No results found for this or any previous visit (from the past 24 hours).  Patient Active Problem List   Diagnosis Date Noted   Gestational diabetes mellitus (GDM) 04/08/2023   Poor fetal growth affecting management of mother 02/22/2023   Previous cesarean delivery affecting pregnancy, antepartum 01/14/2023   Elevated blood pressure reading 01/14/2023   Gestational diabetes mellitus (GDM) controlled on oral hypoglycemic drug, antepartum 09/27/2022   Supervision of high risk pregnancy, antepartum 09/13/2022   History of gestational diabetes 08/24/2020    History of miscarriage, currently pregnant 04/07/2020    Assessment/Plan:  Lue Sykora is a 33 y.o. G4P1021 at [redacted]w[redacted]d here for TOLAC IOL CHTN, T2DM (Glyburide)  #Labor:start with Pitocin  #Pain: Per patient request #FWB: Cat 1 #GBS status:  negative #Feeding: Breastmilk  and Formula #Reproductive Life planning: Natural Family Planning #Circ:  yes  #CHTN: - f/u admission PreE labs  - CTM closely   #T2DM: on Glyburide at home, @[redacted]w[redacted]d , CWD, normal anatomy, cephalic presentation, anterior placental lie, 3298g, 48% EFW, proven to 2098 - CGBs q4hrs  - hold home Glyburide as concern for hypoglycemia w/ limited PO  #Hx of prior C/S: 2022, recurrent variable decelerations and 2 rounds of prolonged decelerations while on pitocin, called for NRFHR.    Hessie Dibble, MD  04/08/2023, 6:57 AM

## 2023-04-08 NOTE — Plan of Care (Signed)
  Problem: Education: Goal: Knowledge of General Education information will improve Description: Including pain rating scale, medication(s)/side effects and non-pharmacologic comfort measures Outcome: Progressing   Problem: Health Behavior/Discharge Planning: Goal: Ability to manage health-related needs will improve Outcome: Progressing   Problem: Clinical Measurements: Goal: Ability to maintain clinical measurements within normal limits will improve Outcome: Progressing Goal: Will remain free from infection Outcome: Progressing Goal: Diagnostic test results will improve Outcome: Progressing Goal: Respiratory complications will improve Outcome: Progressing Goal: Cardiovascular complication will be avoided Outcome: Progressing   Problem: Activity: Goal: Risk for activity intolerance will decrease Outcome: Progressing   Problem: Nutrition: Goal: Adequate nutrition will be maintained Outcome: Progressing   Problem: Coping: Goal: Level of anxiety will decrease Outcome: Progressing   Problem: Elimination: Goal: Will not experience complications related to bowel motility Outcome: Progressing Goal: Will not experience complications related to urinary retention Outcome: Progressing   Problem: Pain Managment: Goal: General experience of comfort will improve and/or be controlled Outcome: Progressing   Problem: Safety: Goal: Ability to remain free from injury will improve Outcome: Progressing   Problem: Skin Integrity: Goal: Risk for impaired skin integrity will decrease Outcome: Progressing   Problem: Education: Goal: Knowledge of Childbirth will improve Outcome: Progressing Goal: Ability to make informed decisions regarding treatment and plan of care will improve Outcome: Progressing Goal: Ability to state and carry out methods to decrease the pain will improve Outcome: Progressing Goal: Individualized Educational Video(s) Outcome: Progressing   Problem:  Coping: Goal: Ability to verbalize concerns and feelings about labor and delivery will improve Outcome: Progressing   Problem: Life Cycle: Goal: Ability to make normal progression through stages of labor will improve Outcome: Progressing Goal: Ability to effectively push during vaginal delivery will improve Outcome: Progressing   Problem: Role Relationship: Goal: Will demonstrate positive interactions with the child Outcome: Progressing   Problem: Safety: Goal: Risk of complications during labor and delivery will decrease Outcome: Progressing   Problem: Pain Management: Goal: Relief or control of pain from uterine contractions will improve Outcome: Progressing   Problem: Education: Goal: Knowledge of disease or condition will improve Outcome: Progressing Goal: Knowledge of the prescribed therapeutic regimen will improve Outcome: Progressing   Problem: Fluid Volume: Goal: Peripheral tissue perfusion will improve Outcome: Progressing   Problem: Clinical Measurements: Goal: Complications related to disease process, condition or treatment will be avoided or minimized Outcome: Progressing   Problem: Education: Goal: Ability to describe self-care measures that may prevent or decrease complications (Diabetes Survival Skills Education) will improve Outcome: Progressing Goal: Individualized Educational Video(s) Outcome: Progressing   Problem: Coping: Goal: Ability to adjust to condition or change in health will improve Outcome: Progressing   Problem: Fluid Volume: Goal: Ability to maintain a balanced intake and output will improve Outcome: Progressing   Problem: Health Behavior/Discharge Planning: Goal: Ability to identify and utilize available resources and services will improve Outcome: Progressing Goal: Ability to manage health-related needs will improve Outcome: Progressing   Problem: Metabolic: Goal: Ability to maintain appropriate glucose levels will  improve Outcome: Progressing   Problem: Nutritional: Goal: Maintenance of adequate nutrition will improve Outcome: Progressing Goal: Progress toward achieving an optimal weight will improve Outcome: Progressing   Problem: Skin Integrity: Goal: Risk for impaired skin integrity will decrease Outcome: Progressing   Problem: Tissue Perfusion: Goal: Adequacy of tissue perfusion will improve Outcome: Progressing

## 2023-04-08 NOTE — Progress Notes (Addendum)
 Labor Progress Note Lisa Ritter is a 33 y.o. [id number] at [redacted]w[redacted]d presented for TOLAC IOL for [facility] and T2DM.  S: resting comfortably in bed.   O:  BP 116/69   Pulse 64   Temp 97.7 F (36.5 C) (Oral)   Resp 15   Ht 5\' 2"  (1.575 m)   Wt 92.6 kg   LMP 07/09/2022 (Exact Date)   BMI 37.35 kg/m  EFM: 135 bpm/moderate variability /accelerations; no decelerations   CVE: Dilation: 1 Effacement (%): 50 Cervical Position: Middle Station: -2 Presentation: Vertex Exam by:: Dr. Leanora Cover   A&P: 33 y.o. [id number] [redacted]w[redacted]d presenting for TOLAC IOL for CHTN and T2DM.  #Labor: Progressing well. AROM with clear fluid. Continue pitocin 2x2.  #Pain: Well controlled. Considering epidural.  #FWB: reactive  #GBS negative  #CHTN: admission Pre E labs with PCR 0.3, so now pre-eclampsia without severe features. BP moderately elevated on admission. Most recently has been WNL. No severe range pressures.   #T2DM: on glyburide pre admission. Hold on admission. Q4H POC CBG in latent labor, Q2H POC CBG in active labor. Endotool.   Denton Ar, MD 3:16 PM  GME ATTESTATION:  Evaluation and management procedures were performed by the [facility] Medicine Resident under my supervision. I was immediately available for direct supervision, assistance and direction throughout this encounter.  I also confirm that I have verified the information documented in the resident's note, and that I have also personally reperformed the pertinent components of the physical exam and all of the medical decision making activities.  I have also made any necessary editorial changes.  Wyn Forster, MD OB Fellow, Faculty Practice [facility], Center for [facility] 04/08/2023 7:42 PM

## 2023-04-08 NOTE — Progress Notes (Signed)
 Hypoglycemic Event  CBG: 65  Treatment: 8 oz juice/soda  Symptoms: None  Follow-up CBG: Time:1710 CBG Result: 69  2nd Follow-up CBG:  Time: 1725  CBG Result: 73  Possible Reasons for Event: Inadequate meal intake and Other: labor  Comments/MD notified: RN took cbg at 1655, result of cbg was 65. RN gave patient 4 oz juice and retook cbg at 1710, result of cbg was 69. Another juice was given and MD was notified, MD stated to retake in another 15 minutes after juice was consumed and call with result. Cbg was retaken at 1725, result of cbg was 73. MD notified and patient was advised to continue drinking juice periodically to maintain cbg over 70.     Loetta Rough, RN

## 2023-04-08 NOTE — Anesthesia Preprocedure Evaluation (Addendum)
 Anesthesia Evaluation  Patient identified by MRN, date of birth, ID band Patient awake    Reviewed: Allergy & Precautions, NPO status , Patient's Chart, lab work & pertinent test results  Airway Mallampati: II  TM Distance: >3 FB Neck ROM: Full    Dental   Pulmonary neg pulmonary ROS   breath sounds clear to auscultation       Cardiovascular hypertension (Pre-E without severe features),  Rhythm:Regular Rate:Normal     Neuro/Psych negative neurological ROS     GI/Hepatic negative GI ROS, Neg liver ROS,,,  Endo/Other  diabetes, Gestational    Renal/GU negative Renal ROS     Musculoskeletal   Abdominal   Peds  Hematology negative hematology ROS (+)   Anesthesia Other Findings   Reproductive/Obstetrics (+) Pregnancy (TOLAC)                             Anesthesia Physical Anesthesia Plan  ASA: 2  Anesthesia Plan: Epidural   Post-op Pain Management:    Induction:   PONV Risk Score and Plan: 2 and Treatment may vary due to age or medical condition  Airway Management Planned: Natural Airway  Additional Equipment:   Intra-op Plan:   Post-operative Plan:   Informed Consent: I have reviewed the patients History and Physical, chart, labs and discussed the procedure including the risks, benefits and alternatives for the proposed anesthesia with the patient or authorized representative who has indicated his/her understanding and acceptance.       Plan Discussed with:   Anesthesia Plan Comments:        Anesthesia Quick Evaluation

## 2023-04-08 NOTE — Progress Notes (Signed)
 Spoke with providers about pitocin being at 16; ctx q1-82min; pt feeling the ctx more. RN does not feel comfortable going up without IUPC. RN holding at 16 until next cervical exam at 1515.

## 2023-04-09 ENCOUNTER — Encounter (HOSPITAL_COMMUNITY): Payer: Self-pay | Admitting: Obstetrics and Gynecology

## 2023-04-09 ENCOUNTER — Other Ambulatory Visit: Payer: Self-pay

## 2023-04-09 ENCOUNTER — Encounter (HOSPITAL_COMMUNITY)
Admission: RE | Disposition: A | Discharge status: Home / Self Care | Payer: Self-pay | Source: Home / Self Care | Attending: Family Medicine

## 2023-04-09 DIAGNOSIS — Z3A39 39 weeks gestation of pregnancy: Secondary | ICD-10-CM | POA: Diagnosis not present

## 2023-04-09 DIAGNOSIS — O24424 Gestational diabetes mellitus in childbirth, insulin controlled: Secondary | ICD-10-CM | POA: Diagnosis not present

## 2023-04-09 DIAGNOSIS — O34211 Maternal care for low transverse scar from previous cesarean delivery: Secondary | ICD-10-CM

## 2023-04-09 DIAGNOSIS — O365930 Maternal care for other known or suspected poor fetal growth, third trimester, not applicable or unspecified: Secondary | ICD-10-CM | POA: Diagnosis not present

## 2023-04-09 DIAGNOSIS — O1404 Mild to moderate pre-eclampsia, complicating childbirth: Secondary | ICD-10-CM | POA: Diagnosis not present

## 2023-04-09 LAB — CBC
HCT: 34.5 % — ABNORMAL LOW (ref 36.0–46.0)
Hemoglobin: 12.1 g/dL (ref 12.0–15.0)
MCH: 29.2 pg (ref 26.0–34.0)
MCHC: 35.1 g/dL (ref 30.0–36.0)
MCV: 83.1 fL (ref 80.0–100.0)
Platelets: 286 10*3/uL (ref 150–400)
RBC: 4.15 MIL/uL (ref 3.87–5.11)
RDW: 12.9 % (ref 11.5–15.5)
WBC: 26.4 10*3/uL — ABNORMAL HIGH (ref 4.0–10.5)
nRBC: 0 % (ref 0.0–0.2)

## 2023-04-09 LAB — BIRTH TISSUE RECOVERY COLLECTION (PLACENTA DONATION)

## 2023-04-09 LAB — CREATININE, SERUM
Creatinine, Ser: 1.09 mg/dL — ABNORMAL HIGH (ref 0.44–1.00)
GFR, Estimated: >60 mL/min (ref >60)

## 2023-04-09 LAB — GLUCOSE, CAPILLARY: Glucose-Capillary: 94 mg/dL (ref 70–99)

## 2023-04-09 SURGERY — Surgical Case
Anesthesia: Epidural

## 2023-04-09 MED ORDER — ONDANSETRON HCL 4 MG/2ML IJ SOLN: 4.0000 mg | Freq: Three times a day (TID) | INTRAMUSCULAR | Status: DC | PRN

## 2023-04-09 MED ORDER — DIBUCAINE (PERIANAL) 1 % EX OINT: 1.0000 | TOPICAL_OINTMENT | CUTANEOUS | Status: DC | PRN

## 2023-04-09 MED ORDER — KETOROLAC TROMETHAMINE 30 MG/ML IJ SOLN
30.0000 mg | Freq: Four times a day (QID) | INTRAMUSCULAR | Status: AC
  Administered 2023-04-09 – 2023-04-10 (×4): 30 mg via INTRAVENOUS
  Filled 2023-04-09 (×4): qty 1

## 2023-04-09 MED ORDER — MENTHOL 3 MG MT LOZG: 1.0000 | LOZENGE | OROMUCOSAL | Status: DC | PRN

## 2023-04-09 MED ORDER — CLINDAMYCIN PHOSPHATE 900 MG/50ML IV SOLN
INTRAVENOUS | Status: AC
  Filled 2023-04-09: qty 50

## 2023-04-09 MED ORDER — MAGNESIUM HYDROXIDE 400 MG/5ML PO SUSP: 30.0000 mL | ORAL | Status: DC | PRN

## 2023-04-09 MED ORDER — OXYTOCIN-SODIUM CHLORIDE 30-0.9 UT/500ML-% IV SOLN
INTRAVENOUS | Status: AC
  Filled 2023-04-09: qty 500

## 2023-04-09 MED ORDER — ENOXAPARIN SODIUM 60 MG/0.6ML IJ SOSY
45.0000 mg | PREFILLED_SYRINGE | INTRAMUSCULAR | Status: DC
  Administered 2023-04-09 – 2023-04-11 (×3): 45 mg via SUBCUTANEOUS
  Filled 2023-04-09 (×3): qty 0.6

## 2023-04-09 MED ORDER — TRANEXAMIC ACID-NACL 1000-0.7 MG/100ML-% IV SOLN
INTRAVENOUS | Status: AC
  Filled 2023-04-09: qty 100

## 2023-04-09 MED ORDER — TRANEXAMIC ACID-NACL 1000-0.7 MG/100ML-% IV SOLN
INTRAVENOUS | Status: DC | PRN
  Administered 2023-04-09: 1000 mg via INTRAVENOUS

## 2023-04-09 MED ORDER — MORPHINE SULFATE (PF) 0.5 MG/ML IJ SOLN
INTRAMUSCULAR | Status: AC
Start: 2023-04-09 — End: ?
  Filled 2023-04-09: qty 10

## 2023-04-09 MED ORDER — DEXAMETHASONE SODIUM PHOSPHATE 10 MG/ML IJ SOLN
INTRAMUSCULAR | Status: DC | PRN
  Administered 2023-04-09: 10 mg via INTRAVENOUS

## 2023-04-09 MED ORDER — OXYCODONE HCL 5 MG PO TABS
5.0000 mg | ORAL_TABLET | ORAL | Status: DC | PRN
  Administered 2023-04-10 – 2023-04-11 (×3): 5 mg via ORAL
  Administered 2023-04-11 – 2023-04-12 (×2): 10 mg via ORAL
  Filled 2023-04-09: qty 2
  Filled 2023-04-09 (×2): qty 1
  Filled 2023-04-09: qty 2
  Filled 2023-04-09: qty 1

## 2023-04-09 MED ORDER — DEXMEDETOMIDINE HCL IN NACL 200 MCG/50ML IV SOLN
INTRAVENOUS | Status: DC | PRN
  Administered 2023-04-09 (×2): 8 ug via INTRAVENOUS

## 2023-04-09 MED ORDER — FENTANYL CITRATE (PF) 100 MCG/2ML IJ SOLN
INTRAMUSCULAR | Status: AC
  Filled 2023-04-09: qty 2

## 2023-04-09 MED ORDER — PHENYLEPHRINE 80 MCG/ML (10ML) SYRINGE FOR IV PUSH (FOR BLOOD PRESSURE SUPPORT)
PREFILLED_SYRINGE | INTRAVENOUS | Status: AC
  Filled 2023-04-09: qty 10

## 2023-04-09 MED ORDER — KETOROLAC TROMETHAMINE 30 MG/ML IJ SOLN: 30.0000 mg | Freq: Four times a day (QID) | INTRAMUSCULAR | Status: DC | PRN

## 2023-04-09 MED ORDER — SCOPOLAMINE 1 MG/3DAYS TD PT72
1.0000 | MEDICATED_PATCH | Freq: Once | TRANSDERMAL | Status: AC
  Administered 2023-04-09: 1.5 mg via TRANSDERMAL
  Filled 2023-04-09: qty 1

## 2023-04-09 MED ORDER — ZOLPIDEM TARTRATE 5 MG PO TABS: 5.0000 mg | ORAL_TABLET | Freq: Every evening | ORAL | Status: DC | PRN

## 2023-04-09 MED ORDER — MEDROXYPROGESTERONE ACETATE 150 MG/ML IM SUSP: 150.0000 mg | INTRAMUSCULAR | Status: DC | PRN

## 2023-04-09 MED ORDER — ACETAMINOPHEN 10 MG/ML IV SOLN
INTRAVENOUS | Status: DC | PRN
  Administered 2023-04-09: 1000 mg via INTRAVENOUS

## 2023-04-09 MED ORDER — DIPHENHYDRAMINE HCL 50 MG/ML IJ SOLN
12.5000 mg | INTRAMUSCULAR | Status: DC | PRN
  Administered 2023-04-09: 12.5 mg via INTRAVENOUS
  Filled 2023-04-09: qty 1

## 2023-04-09 MED ORDER — OXYTOCIN-SODIUM CHLORIDE 30-0.9 UT/500ML-% IV SOLN
INTRAVENOUS | Status: DC | PRN
  Administered 2023-04-09: 30 [IU] via INTRAVENOUS

## 2023-04-09 MED ORDER — LACTATED RINGERS IV SOLN: INTRAVENOUS | Status: DC

## 2023-04-09 MED ORDER — IBUPROFEN 600 MG PO TABS: 600.0000 mg | ORAL_TABLET | Freq: Four times a day (QID) | ORAL | Status: DC

## 2023-04-09 MED ORDER — ONDANSETRON HCL 4 MG/2ML IJ SOLN
INTRAMUSCULAR | Status: DC | PRN
  Administered 2023-04-09: 4 mg via INTRAVENOUS

## 2023-04-09 MED ORDER — ONDANSETRON HCL 4 MG/2ML IJ SOLN
INTRAMUSCULAR | Status: AC
  Filled 2023-04-09: qty 2

## 2023-04-09 MED ORDER — FUROSEMIDE 20 MG PO TABS
20.0000 mg | ORAL_TABLET | Freq: Every day | ORAL | Status: DC
  Administered 2023-04-10 – 2023-04-12 (×3): 20 mg via ORAL
  Filled 2023-04-09 (×3): qty 1

## 2023-04-09 MED ORDER — MEASLES, MUMPS & RUBELLA VAC IJ SOLR
0.5000 mL | Freq: Once | INTRAMUSCULAR | Status: DC
  Filled 2023-04-09: qty 0.5

## 2023-04-09 MED ORDER — SODIUM CHLORIDE 0.9% FLUSH: 3.0000 mL | INTRAVENOUS | Status: DC | PRN

## 2023-04-09 MED ORDER — GABAPENTIN 100 MG PO CAPS
200.0000 mg | ORAL_CAPSULE | Freq: Every day | ORAL | Status: DC
  Administered 2023-04-09 – 2023-04-11 (×3): 200 mg via ORAL
  Filled 2023-04-09 (×3): qty 2

## 2023-04-09 MED ORDER — NALOXONE HCL 0.4 MG/ML IJ SOLN: 0.4000 mg | INTRAMUSCULAR | Status: DC | PRN

## 2023-04-09 MED ORDER — SENNOSIDES-DOCUSATE SODIUM 8.6-50 MG PO TABS
2.0000 | ORAL_TABLET | Freq: Every day | ORAL | Status: DC
  Administered 2023-04-10 – 2023-04-11 (×2): 2 via ORAL
  Filled 2023-04-09 (×3): qty 2

## 2023-04-09 MED ORDER — GENTAMICIN SULFATE 40 MG/ML IJ SOLN
5.0000 mg/kg | Freq: Once | INTRAVENOUS | Status: AC
  Administered 2023-04-09: 335.6 mg via INTRAVENOUS
  Filled 2023-04-09: qty 8.5

## 2023-04-09 MED ORDER — ACETAMINOPHEN 500 MG PO TABS
1000.0000 mg | ORAL_TABLET | Freq: Four times a day (QID) | ORAL | Status: DC
  Administered 2023-04-09 – 2023-04-12 (×10): 1000 mg via ORAL
  Filled 2023-04-09 (×11): qty 2

## 2023-04-09 MED ORDER — PHENYLEPHRINE HCL (PRESSORS) 10 MG/ML IV SOLN
INTRAVENOUS | Status: DC | PRN
Start: 2023-04-09 — End: 2023-04-09
  Administered 2023-04-09 (×3): 160 ug via INTRAVENOUS

## 2023-04-09 MED ORDER — DIPHENHYDRAMINE HCL 25 MG PO CAPS: 25.0000 mg | ORAL_CAPSULE | Freq: Four times a day (QID) | ORAL | Status: DC | PRN

## 2023-04-09 MED ORDER — MORPHINE SULFATE (PF) 0.5 MG/ML IJ SOLN
INTRAMUSCULAR | Status: DC | PRN
  Administered 2023-04-09: 3 mg via EPIDURAL

## 2023-04-09 MED ORDER — SIMETHICONE 80 MG PO CHEW
80.0000 mg | CHEWABLE_TABLET | Freq: Three times a day (TID) | ORAL | Status: DC
  Administered 2023-04-09 – 2023-04-12 (×8): 80 mg via ORAL
  Filled 2023-04-09 (×10): qty 1

## 2023-04-09 MED ORDER — CLINDAMYCIN PHOSPHATE 900 MG/50ML IV SOLN
INTRAVENOUS | Status: DC | PRN
  Administered 2023-04-09: 900 mg via INTRAVENOUS

## 2023-04-09 MED ORDER — DIPHENHYDRAMINE HCL 25 MG PO CAPS: 25.0000 mg | ORAL_CAPSULE | ORAL | Status: DC | PRN

## 2023-04-09 MED ORDER — NALOXONE HCL 4 MG/10ML IJ SOLN: 1.0000 ug/kg/h | INTRAVENOUS | Status: DC | PRN

## 2023-04-09 MED ORDER — SODIUM CHLORIDE 0.9 % IR SOLN
Status: DC | PRN
  Administered 2023-04-09: 1

## 2023-04-09 MED ORDER — ACETAMINOPHEN 500 MG PO TABS: 1000.0000 mg | ORAL_TABLET | Freq: Four times a day (QID) | ORAL | Status: DC

## 2023-04-09 MED ORDER — POTASSIUM CHLORIDE 20 MEQ PO PACK
20.0000 meq | PACK | Freq: Every day | ORAL | Status: DC
  Administered 2023-04-10 – 2023-04-12 (×3): 20 meq via ORAL
  Filled 2023-04-09 (×3): qty 1

## 2023-04-09 MED ORDER — WITCH HAZEL-GLYCERIN EX PADS: 1.0000 | MEDICATED_PAD | CUTANEOUS | Status: DC | PRN

## 2023-04-09 MED ORDER — LACTATED RINGERS AMNIOINFUSION: INTRAVENOUS | Status: DC

## 2023-04-09 MED ORDER — LIDOCAINE-EPINEPHRINE (PF) 2 %-1:200000 IJ SOLN
INTRAMUSCULAR | Status: DC | PRN
  Administered 2023-04-09: 5 mL via EPIDURAL
  Administered 2023-04-09: 10 mL via EPIDURAL
  Administered 2023-04-09: 5 mL via EPIDURAL

## 2023-04-09 MED ORDER — DIPHENHYDRAMINE HCL 50 MG/ML IJ SOLN
25.0000 mg | Freq: Once | INTRAMUSCULAR | Status: AC
  Administered 2023-04-09: 25 mg via INTRAVENOUS
  Filled 2023-04-09: qty 1

## 2023-04-09 MED ORDER — OXYTOCIN-SODIUM CHLORIDE 30-0.9 UT/500ML-% IV SOLN: 2.5000 [IU]/h | INTRAVENOUS | Status: AC

## 2023-04-09 MED ORDER — PRENATAL MULTIVITAMIN CH
1.0000 | ORAL_TABLET | Freq: Every day | ORAL | Status: DC
  Administered 2023-04-09 – 2023-04-11 (×3): 1 via ORAL
  Filled 2023-04-09 (×3): qty 1

## 2023-04-09 MED ORDER — DEXAMETHASONE SODIUM PHOSPHATE 10 MG/ML IJ SOLN
INTRAMUSCULAR | Status: AC
Start: 2023-04-09 — End: ?
  Filled 2023-04-09: qty 1

## 2023-04-09 MED ORDER — TETANUS-DIPHTH-ACELL PERTUSSIS 5-2.5-18.5 LF-MCG/0.5 IM SUSY
0.5000 mL | PREFILLED_SYRINGE | Freq: Once | INTRAMUSCULAR | Status: DC
  Filled 2023-04-09: qty 0.5

## 2023-04-09 MED ORDER — COCONUT OIL OIL
1.0000 | TOPICAL_OIL | Status: DC | PRN
  Administered 2023-04-09: 1 via TOPICAL

## 2023-04-09 MED ORDER — SIMETHICONE 80 MG PO CHEW
80.0000 mg | CHEWABLE_TABLET | ORAL | Status: DC | PRN
  Administered 2023-04-11: 80 mg via ORAL

## 2023-04-09 SURGICAL SUPPLY — 34 items
BARRIER ADHS 3X4 INTERCEED (GAUZE/BANDAGES/DRESSINGS) IMPLANT
BENZOIN TINCTURE PRP APPL 2/3 (GAUZE/BANDAGES/DRESSINGS) IMPLANT
CLAMP UMBILICAL CORD (MISCELLANEOUS) ×1 IMPLANT
CLIP FILSHIE TUBAL LIGA STRL (Clip) IMPLANT
CLOTH BEACON ORANGE TIMEOUT ST (SAFETY) ×1 IMPLANT
DRSG OPSITE POSTOP 4X10 (GAUZE/BANDAGES/DRESSINGS) ×1 IMPLANT
ELECT REM PT RETURN 9FT ADLT (ELECTROSURGICAL) ×1 IMPLANT
ELECTRODE REM PT RTRN 9FT ADLT (ELECTROSURGICAL) ×1 IMPLANT
EXTRACTOR VACUUM KIWI (MISCELLANEOUS) IMPLANT
GAUZE PAD ABD 7.5X8 STRL (GAUZE/BANDAGES/DRESSINGS) IMPLANT
GAUZE SPONGE 4X4 12PLY STRL LF (GAUZE/BANDAGES/DRESSINGS) IMPLANT
GLOVE BIO SURGEON STRL SZ 6.5 (GLOVE) ×1 IMPLANT
GLOVE BIOGEL PI IND STRL 7.0 (GLOVE) ×2 IMPLANT
GOWN STRL REUS W/TWL LRG LVL3 (GOWN DISPOSABLE) ×2 IMPLANT
HEMOSTAT ARISTA ABSORB 3G PWDR (HEMOSTASIS) IMPLANT
KIT ABG SYR 3ML LUER SLIP (SYRINGE) IMPLANT
MAT PREVALON FULL STRYKER (MISCELLANEOUS) IMPLANT
NDL HYPO 25X5/8 SAFETYGLIDE (NEEDLE) IMPLANT
NEEDLE HYPO 25X5/8 SAFETYGLIDE (NEEDLE) ×1 IMPLANT
NS IRRIG 1000ML POUR BTL (IV SOLUTION) ×1 IMPLANT
PACK C SECTION WH (CUSTOM PROCEDURE TRAY) ×1 IMPLANT
PAD OB MATERNITY 11 LF (PERSONAL CARE ITEMS) IMPLANT
RETRACTOR WND ALEXIS 25 LRG (MISCELLANEOUS) IMPLANT
RTRCTR WOUND ALEXIS 25CM LRG (MISCELLANEOUS) IMPLANT
SPONGE T-LAP 18X18 ~~LOC~~+RFID (SPONGE) IMPLANT
STRIP CLOSURE SKIN 1/2X4 (GAUZE/BANDAGES/DRESSINGS) IMPLANT
SUT PLAIN ABS 2-0 CT1 27XMFL (SUTURE) IMPLANT
SUT VIC AB 0 CT1 36 (SUTURE) ×6 IMPLANT
SUT VIC AB 2-0 CT1 TAPERPNT 27 (SUTURE) ×1 IMPLANT
SUT VIC AB 4-0 PS2 27 (SUTURE) ×1 IMPLANT
SYR CONTROL 10ML LL (SYRINGE) IMPLANT
TOWEL OR 17X24 6PK STRL BLUE (TOWEL DISPOSABLE) ×1 IMPLANT
TRAY FOLEY W/BAG SLVR 14FR LF (SET/KITS/TRAYS/PACK) IMPLANT
WATER STERILE IRR 1000ML POUR (IV SOLUTION) ×1 IMPLANT

## 2023-04-09 NOTE — Discharge Summary (Signed)
 Postpartum Discharge Summary     Patient Name: Lisa Ritter DOB: Mar 10, 1990 MRN: [id number]  Date of admission: 04/08/2023 Delivery date:04/09/2023 Delivering provider: Adam Phenix Date of discharge: 04/12/2023  Admitting diagnosis: Pregnancy at 39/0. IOL for CHTN and DM2 (PO meds).    Secondary diagnosis:  History of prior c-section  Additional problems: BMI 30s    Discharge diagnosis: Term Pregnancy Delivered, CHTN with superimposed severe preeclampsia, and Type 2 DM and Emergency rLTCS 2/2 NRFHT                                       Post partum procedures: none Augmentation: AROM and Pitocin Complications: non reassuring fetal heart tones in labor. Mild pre-eclampsia  Hospital course: Patient hadn her IOL but then had NRFHTs at 8cm and needed a stat RLTCS, which was uncomplicated.  She underwent 24h of Mg and her BPs were great and she was just on lasix.  On the day of discharge, her BPs were going up so she was started on procardia.  Patient did well on no meds postpartum for her CBGs. Patient instructed to keep checking sugars for now.   She was meeting all postop and postpartum goals, including flatus. Patient unsure about birth control.   Newborn Data: Birth date:04/09/2023 Birth time:5:56 AM Gender:Female Living status:Living Apgars:1 ,4  Weight:3310 g                               Magnesium Sulfate received: Yes: Seizure prophylaxis  Immunizations received: There is no immunization history for the selected administration types on file for this patient.  Physical exam  Vitals:   04/11/23 1926 04/12/23 0017 04/12/23 0237 04/12/23 0821  BP: (!) 157/70 131/76 139/75 137/76  Pulse: 83 74 75 93  Resp: 18 17 18 18   Temp: 98.2 F (36.8 C) (!) 97.5 F (36.4 C)  98.5 F (36.9 C)  TempSrc: Oral Axillary  Axillary  SpO2: 100% 100% 100% 98%  Weight:      Height:       General: alert Lochia: appropriate Uterine Fundus: firm, nttp Incision: Dressing is clean, dry,  and intact DVT Evaluation: No evidence of DVT seen on physical exam. Labs: Lab Results  Component Value Date   WBC 25.7 (H) 04/10/2023   HGB 11.0 (L) 04/10/2023   HCT 32.6 (L) 04/10/2023   MCV 85.1 04/10/2023   PLT 291 04/10/2023      Latest Ref Rng & Units 04/11/2023    5:31 AM  CMP  Glucose 70 - 99 mg/dL 83   BUN 6 - 20 mg/dL 13   Creatinine 7.84 - 1.00 mg/dL 6.96   Sodium 295 - 284 mmol/L 136   Potassium 3.5 - 5.1 mmol/L 4.0   Chloride 98 - 111 mmol/L 101   CO2 22 - 32 mmol/L 23   Calcium 8.9 - 10.3 mg/dL 9.2   Total Protein 6.5 - 8.1 g/dL 5.3   Total Bilirubin 0.0 - 1.2 mg/dL 0.4   Alkaline Phos 38 - 126 U/L 137   AST 15 - 41 U/L 24   ALT 0 - 44 U/L 18    Edinburgh Score:    04/09/2023    9:07 AM  Edinburgh Postnatal Depression Scale Screening Tool  I have been able to laugh and see the funny side of things. 0  I have looked forward with enjoyment to things. 0  I have blamed myself unnecessarily when things went wrong. 1  I have been anxious or worried for no good reason. 1  I have felt scared or panicky for no good reason. 0  Things have been getting on top of me. 0  I have been so unhappy that I have had difficulty sleeping. 0  I have felt sad or miserable. 0  I have been so unhappy that I have been crying. 0  The thought of harming myself has occurred to me. 0  Edinburgh Postnatal Depression Scale Total 2   No data recorded  After visit meds:  Allergies as of 04/12/2023       Reactions   Penicillins Other (See Comments)   Unsure of reactions        Medication List     STOP taking these medications    aspirin EC 81 MG tablet   folic acid 400 MCG tablet Commonly known as: FOLVITE   glyBURIDE 2.5 MG tablet Commonly known as: DIABETA   magnesium gluconate 500 MG tablet Commonly known as: MAGONATE       TAKE these medications    Accu-Chek Guide test strip Generic drug: glucose blood Use to check blood sugars four times a day was  instructed   Accu-Chek Softclix Lancets lancets 1 each by Other route 4 (four) times daily.   furosemide 20 MG tablet Commonly known as: LASIX Take 1 tablet (20 mg total) by mouth daily for 2 doses.   gabapentin 100 MG capsule Commonly known as: NEURONTIN Take 2 capsules (200 mg total) by mouth at bedtime for 10 days.   ibuprofen 600 MG tablet Commonly known as: ADVIL Take 1 tablet (600 mg total) by mouth every 6 (six) hours as needed for mild pain (pain score 1-3), headache, fever or cramping.   multivitamin-prenatal 27-0.8 MG Tabs tablet Take 1 tablet by mouth daily at 12 noon.   NIFEdipine 30 MG 24 hr tablet Commonly known as: ADALAT CC Take 1 tablet (30 mg total) by mouth daily.   oxyCODONE-acetaminophen 5-325 MG tablet Commonly known as: PERCOCET/ROXICET Take 1-2 tablets by mouth every 6 (six) hours as needed.   potassium chloride 20 MEQ packet Commonly known as: KLOR-CON Take 20 mEq by mouth daily for 2 doses.   simethicone 80 MG chewable tablet Commonly known as: MYLICON Chew 1 tablet (80 mg total) by mouth 4 (four) times daily as needed for flatulence.   Vitamin D3 50 MCG (2000 UT) Tabs Take by mouth.         Discharge home in stable condition Infant Feeding: Breast Infant Disposition:NICU Discharge instruction: per After Visit Summary and Postpartum booklet. Activity: Advance as tolerated. Pelvic rest for 6 weeks.  Diet: carb modified diet Future Appointments: Future Appointments  Date Time Provider Department Center  04/15/2023  2:00 PM [facility] NURSE [facility] None    04/12/2023 Vashon Bing, MD

## 2023-04-09 NOTE — Inpatient Diabetes Management (Signed)
 Inpatient Diabetes Program Recommendations  AACE/ADA: New Consensus Statement on Inpatient Glycemic Control (2015)  Target Ranges:  Prepandial:   less than 140 mg/dL      Peak postprandial:   less than 180 mg/dL (1-2 hours)      Critically ill patients:  140 - 180 mg/dL   Lab Results  Component Value Date   GLUCAP 94 04/09/2023   HGBA1C 5.5 09/13/2022    Review of Glycemic Control  Latest Reference Range & Units 04/08/23 16:55 04/08/23 17:10 04/08/23 17:24 04/08/23 21:44 04/09/23 01:51  Glucose-Capillary 70 - 99 mg/dL 65 (L) 69 (L) 73 76 94  (L): Data is abnormally low Diabetes history: Type 2 DM (per MD) Outpatient Diabetes medications: Glyburide 2.5 mg BID Current orders for Inpatient glycemic control: none Decadron 10 mg x1   Inpatient Diabetes Program Recommendations:    Consider adding Novolog 0-9 units TID & HS.   Thanks, Lujean Rave, MSN, RNC-OB Diabetes Coordinator [phone] (8a-5p)

## 2023-04-09 NOTE — Lactation Note (Addendum)
 This note was copied from a baby's chart.  NICU Lactation Consultation Note  Patient Name: Lisa Ritter [id number] Date: 04/09/2023 Age:34 hours  Reason for consult: Initial assessment; NICU baby; Term; Other (Comment); Maternal endocrine disorder (Chronic HTN) Type of Endocrine Disorder?: Diabetes (GDM)  SUBJECTIVE  LC in to visit with P2 Mom of baby delivered by emergency C/S for fetal distress.  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  LC met with Mom on OBSC and set her up to pump for the first time.  Mom encouraged to pump consistently every 3 hrs while baby is NPO.  LC demonstrated breast massage and hand expression, colostrum easily expressed.  Mom aware of colostrum protector in place and knows to use for 24 hrs only.  Mom is an experienced pumper with her first baby.  Mom aware of every drop should be collected for baby's oral care/swabbing.  LC set up a wash and a drying bin labeled.  Mom provided with CDC guidelines on pump part cleaning.    Mom aware of lactation support available to her and encouraged her to ask for help prn.  OBJECTIVE Infant data: No data recorded O2 Device: (S) Room Air FiO2 (%): 21 %  Infant feeding assessment No data recorded  Maternal data: [id number] C-Section, Low Transverse Has patient been taught Hand Expression?: Yes Hand Expression Comments: easy flow of colostrum Significant Breast History:: ++ breast changes Current breast feeding challenges:: Infant in NICU Does the patient have breastfeeding experience prior to this delivery?: Yes How long did the patient breastfeed?: 6 months Pumping frequency: Encouraged pumping every 3 hrs, goal of 8 times per 24 hrs Flange Size: 18 Risk factor for low/delayed milk supply:: infant separation  WIC program: No WIC referral Sent? No Pump:  personal DEBP (Medela Pump in Style gifted to her)  ASSESSMENT Infant:  Feeding Status: NPO  Maternal: No data  recorded INTERVENTIONS/PLAN Interventions: Interventions: Breast feeding basics reviewed; Breast massage; Hand express; DEBP; Education; [employer] brochure; CDC Guidelines for Breast Pump Cleaning Tools: Pump; Flanges Pump Education: Setup, frequency, and cleaning; Milk Storage  Plan: 1-Breast massage and hand expression 2- Pump both breasts every 3 hrs, goal of 8 times per 24 hrs 3- Ask for lactation prn.  Consult Status: NICU follow-up NICU Follow-up type: New admission follow up   Judee Clara 04/09/2023, 11:41 AM

## 2023-04-09 NOTE — Plan of Care (Signed)
 Problem: Education: Goal: Knowledge of General Education information will improve Description: Including pain rating scale, medication(s)/side effects and non-pharmacologic comfort measures Outcome: Progressing   Problem: Health Behavior/Discharge Planning: Goal: Ability to manage health-related needs will improve Outcome: Progressing   Problem: Clinical Measurements: Goal: Ability to maintain clinical measurements within normal limits will improve Outcome: Progressing Goal: Will remain free from infection Outcome: Progressing Goal: Diagnostic test results will improve Outcome: Progressing Goal: Respiratory complications will improve Outcome: Progressing Goal: Cardiovascular complication will be avoided Outcome: Progressing   Problem: Activity: Goal: Risk for activity intolerance will decrease Outcome: Progressing   Problem: Nutrition: Goal: Adequate nutrition will be maintained Outcome: Progressing   Problem: Coping: Goal: Level of anxiety will decrease Outcome: Progressing   Problem: Elimination: Goal: Will not experience complications related to bowel motility Outcome: Progressing Goal: Will not experience complications related to urinary retention Outcome: Progressing   Problem: Pain Managment: Goal: General experience of comfort will improve and/or be controlled Outcome: Progressing   Problem: Safety: Goal: Ability to remain free from injury will improve Outcome: Progressing   Problem: Skin Integrity: Goal: Risk for impaired skin integrity will decrease Outcome: Progressing   Problem: Education: Goal: Knowledge of Childbirth will improve Outcome: Progressing Goal: Ability to make informed decisions regarding treatment and plan of care will improve Outcome: Progressing Goal: Ability to state and carry out methods to decrease the pain will improve Outcome: Progressing Goal: Individualized Educational Video(s) Outcome: Progressing   Problem:  Coping: Goal: Ability to verbalize concerns and feelings about labor and delivery will improve Outcome: Progressing   Problem: Life Cycle: Goal: Ability to make normal progression through stages of labor will improve Outcome: Progressing Goal: Ability to effectively push during vaginal delivery will improve Outcome: Progressing   Problem: Role Relationship: Goal: Will demonstrate positive interactions with the child Outcome: Progressing   Problem: Safety: Goal: Risk of complications during labor and delivery will decrease Outcome: Progressing   Problem: Pain Management: Goal: Relief or control of pain from uterine contractions will improve Outcome: Progressing   Problem: Education: Goal: Knowledge of disease or condition will improve Outcome: Progressing Goal: Knowledge of the prescribed therapeutic regimen will improve Outcome: Progressing   Problem: Fluid Volume: Goal: Peripheral tissue perfusion will improve Outcome: Progressing   Problem: Clinical Measurements: Goal: Complications related to disease process, condition or treatment will be avoided or minimized Outcome: Progressing   Problem: Education: Goal: Ability to describe self-care measures that may prevent or decrease complications (Diabetes Survival Skills Education) will improve Outcome: Progressing Goal: Individualized Educational Video(s) Outcome: Progressing   Problem: Coping: Goal: Ability to adjust to condition or change in health will improve Outcome: Progressing   Problem: Fluid Volume: Goal: Ability to maintain a balanced intake and output will improve Outcome: Progressing   Problem: Health Behavior/Discharge Planning: Goal: Ability to identify and utilize available resources and services will improve Outcome: Progressing Goal: Ability to manage health-related needs will improve Outcome: Progressing   Problem: Metabolic: Goal: Ability to maintain appropriate glucose levels will  improve Outcome: Progressing   Problem: Nutritional: Goal: Maintenance of adequate nutrition will improve Outcome: Progressing Goal: Progress toward achieving an optimal weight will improve Outcome: Progressing   Problem: Skin Integrity: Goal: Risk for impaired skin integrity will decrease Outcome: Progressing   Problem: Tissue Perfusion: Goal: Adequacy of tissue perfusion will improve Outcome: Progressing   Problem: Education: Goal: Knowledge of the prescribed therapeutic regimen will improve Outcome: Progressing Goal: Understanding of sexual limitations or changes related to disease process or condition  will improve Outcome: Progressing Goal: Individualized Educational Video(s) Outcome: Progressing   Problem: Self-Concept: Goal: Communication of feelings regarding changes in body function or appearance will improve Outcome: Progressing   Problem: Skin Integrity: Goal: Demonstration of wound healing without infection will improve Outcome: Progressing

## 2023-04-09 NOTE — Op Note (Signed)
 Lisa Ritter PROCEDURE DATE: 04/09/2023  PREOPERATIVE DIAGNOSES: Intrauterine pregnancy at [redacted]w[redacted]d weeks gestation; non-reassuring fetal status  POSTOPERATIVE DIAGNOSES: The same, viable infant delivered  PROCEDURE:  EMERGENCY-Repeat Low Transverse Cesarean Section  SURGEON:  Dr. Debroah Loop  ASSISTANT:  Hessie Dibble, MD An experienced assistant was required given the standard of surgical care given the complexity of the case.  This assistant was needed for exposure, dissection, suctioning, retraction, instrument exchange, assisting with delivery with administration of fundal pressure, and for overall help during the procedure.  ANESTHESIOLOGY TEAM: Anesthesiologist: Marcene Duos, MD CRNA: Algis Greenhouse, CRNA; Maness, Howie Ill, CRNA; Sterling, Jannet Askew, CRNA  INDICATIONS: Lisa Ritter is a 33 y.o. [phone] at [redacted]w[redacted]d here for cesarean section secondary to the indications listed under preoperative diagnoses; please see preoperative note for further details.  The risks of surgery were discussed with the patient including but were not limited to: bleeding which may require transfusion or reoperation; infection which may require antibiotics; injury to bowel, bladder, ureters or other surrounding organs; injury to the fetus; need for additional procedures including hysterectomy in the event of a life-threatening hemorrhage; formation of adhesions; placental abnormalities wth subsequent pregnancies; incisional problems; thromboembolic phenomenon and other postoperative/anesthesia complications.  The patient concurred with the proposed plan, giving informed written consent for the procedure.    FINDINGS:   Female infant in cephalic presentation with nuchal.  Apgars 1 and 4 and 6. Per neonatology infant intubated and cooled with improving tone.  Amniotic fluid: clear.  Intact placenta, three vessel cord.  Normal uterus, fallopian tubes and ovaries bilaterally.  ANESTHESIA: epidural INTRAVENOUS FLUIDS:  1500 ml   ESTIMATED BLOOD LOSS: 870 ml URINE OUTPUT:  100 ml UOP, clear  SPECIMENS: Placenta sent to pathology and venous cord gas collected COMPLICATIONS: None immediate  PROCEDURE IN DETAIL:  The patient preoperatively received intravenous antibiotics and had sequential compression devices applied to her lower extremities.  She was then taken to the operating room where the epidural was dosed up to a surgical level and found to be adequate. She was then placed in a dorsal supine position with a leftward tilt, and prepped and draped in a sterile manner.  A foley catheter was  was already in place.  After an adequate timeout was performed, a Pfannenstiel skin incision was made with scalpel and carried through to the underlying layer of fascia. The fascia was incised in the midline, and this incision was extended bluntly. The rectus muscles were separated in the midline and the peritoneum was entered bluntly.   The Alexis self-retaining retractor was introduced into the abdominal cavity.  Attention was turned to the lower uterine segment where a low transverse hysterotomy was made with a scalpel and extended bluntly in caudad and cephalad directions.  The infant was successfully delivered, the cord was clamped and cut immediately and the infant was handed over to the awaiting neonatology team. Uterine massage was then administered, and the placenta delivered intact with a three-vessel cord. The uterus was then cleared of clots and debris. Lower uterine midline extension found and repaired with 0-Vicryl in a running fashion. The hysterotomy was closed with 0-Vicryl in a running fashion.  A second lower uterine extension discovered inferior to apex of midline extension and thusly repaired with 0- Vicryl in running fashion over lower uterine.2 additional Figure-of-eight 0 Vicryl serosal stitches were placed in lower uterine segment to help with hemostasis.    The pelvis was cleared of all clot and debris.  Arista used to  assist with hemostasis. Hemostasis was confirmed on all surfaces.  The uterus incision was once again inspected and found to be hemostatic. The retractor was removed.  The peritoneum was closed with a 2-0 Vicryl running stitch. The fascia was then closed using 0 Vicryl in a running fashion.  The subcutaneous layer was irrigated, any areas of bleeding were cauterized with the bovie,  was reapproximated with 2-0 plain gut in a running fashion, was found to be hemostatic.. . The skin was closed with a 4-0 Vicryl subcuticular stitch. The patient tolerated the procedure well. Sponge, instrument and needle counts were correct x 3.  She was taken to the recovery room in stable condition.   Hessie Dibble, MD FMOB Fellow, Faculty practice [facility], Center for [facility] 04/09/23  7:35 AM

## 2023-04-09 NOTE — Progress Notes (Signed)
 Provider at bedside. SVE. Strip viewed. Attempt to push with patient with provider. Difficulty tracing fhr with external monitor.FSE placed by provider. FSE unable to trace FHR.  Bedside US done. FHR noted to be bradycardic. No change in HR with maternal position change. Code cesarean called by provider.

## 2023-04-09 NOTE — Progress Notes (Addendum)
 Labor Progress Note Lisa Ritter is a 33 y.o. [id number] at [redacted]w[redacted]d who presented for TOLAC IOL for cHTN and T2DM.  S: Patient reports she is feeling okay with epidural but recently starting to have some flank/back discomfort.  O:  BP 138/79   Pulse 82   Temp 98.8 F (37.1 C) (Axillary)   Resp 17   Ht 5\' 2"  (1.575 m)   Wt 92.6 kg   LMP 07/09/2022 (Exact Date)   SpO2 100%   BMI 37.35 kg/m  FHT: 150bpm baseline, moderate variability, + accels, intermittent decels CTX: q67min on IUPC  CVE: Dilation: 8 Effacement (%): 90 Cervical Position: Posterior Station: -1 Presentation: Vertex Exam by:: Dr. Judd Lien   A&P:   Lisa Ritter is a 44W [id number] at [redacted]w[redacted]d who presents for TOLAC IOL for cHTN and T2DM.   --IOL: Most recent SVE 8/90/-1 at 03:35. Plan to continue pitocin (currently at 89mL/hr) with caution for tachysystole and amnioinfusion at 125cc/hr (toco baseline not elevated and return adequate, no abd pain mentioned by patient). Will give one-time benadryl dose for cervical edema. --FWB: Category II due to intermittent decels, but remains overall reassuring. Continue to monitor. --SIPE w/o SF: cHTN without home meds. Ruled in for SIPE on admit by UPC. Asymptomatic. Continue to monitor for development of SF.         --BP labs: UPC 0.3* // 272 // 25/18, Cr 0.61         --One SRBP at 19:27, although unclear if 2/2 pain. Continue to monitor.         --PRN labetalol and hydralazine         --Low threshold to start mag if recurrent SRBP --T2DM: Glyburide at home, holding given concern for hypoglycemia with limited PO intake intrapartum. Plan to monitor CBGs q4hr. --TOLAC: Hx pLTCS in 2022 called for NRFHR with recurrent variable decels and prolonged decels while on pitocin. Counseled and consented for [facility] 03/06/2023.   Lisa Solo, MD 3:41 AM  Evaluation and management procedures were performed by the [facility] Medicine Resident under my supervision. I was immediately available for  direct supervision, assistance and direction throughout this encounter.  I also confirm that I have verified the information documented in the resident's note, and that I have also personally reperformed the pertinent components of the physical exam and all of the medical decision making activities.  I have also made any necessary editorial changes.   Lisa Bodo, MD Family Medicine - Obstetrics Fellow

## 2023-04-09 NOTE — Transfer of Care (Signed)
 Immediate Anesthesia Transfer of Care Note  Patient: Lisa Ritter  Procedure(s) Performed: CESAREAN DELIVERY  Patient Location: PACU  Anesthesia Type:Epidural  Level of Consciousness: awake  Airway & Oxygen Therapy: Patient Spontanous Breathing  Post-op Assessment: Report given to RN  Post vital signs: Reviewed and stable  Last Vitals:  Vitals Value Taken Time  BP    Temp    Pulse 76 04/09/23 0730  Resp 19 04/09/23 0730  SpO2 96 % 04/09/23 0730  Vitals shown include unfiled device data.  Last Pain:  Vitals:   04/09/23 0456  TempSrc: Axillary  PainSc:          Complications: No notable events documented.

## 2023-04-10 ENCOUNTER — Encounter (HOSPITAL_COMMUNITY): Payer: Self-pay | Admitting: Obstetrics and Gynecology

## 2023-04-10 ENCOUNTER — Encounter: Admitting: Obstetrics and Gynecology

## 2023-04-10 DIAGNOSIS — N179 Acute kidney failure, unspecified: Secondary | ICD-10-CM | POA: Diagnosis not present

## 2023-04-10 LAB — COMPREHENSIVE METABOLIC PANEL WITH GFR
ALT: 21 U/L (ref 0–44)
AST: 29 U/L (ref 15–41)
Albumin: 2.2 g/dL — ABNORMAL LOW (ref 3.5–5.0)
Alkaline Phosphatase: 182 U/L — ABNORMAL HIGH (ref 38–126)
Anion gap: 8 (ref 5–15)
BUN: 16 mg/dL (ref 6–20)
CO2: 22 mmol/L (ref 22–32)
Calcium: 8.9 mg/dL (ref 8.9–10.3)
Chloride: 104 mmol/L (ref 98–111)
Creatinine, Ser: 1.11 mg/dL — ABNORMAL HIGH (ref 0.44–1.00)
GFR, Estimated: >60 mL/min (ref >60)
Glucose, Bld: 161 mg/dL — ABNORMAL HIGH (ref 70–99)
Potassium: 4.5 mmol/L (ref 3.5–5.1)
Sodium: 134 mmol/L — ABNORMAL LOW (ref 135–145)
Total Bilirubin: 0.2 mg/dL (ref 0.0–1.2)
Total Protein: 5.6 g/dL — ABNORMAL LOW (ref 6.5–8.1)

## 2023-04-10 LAB — CBC
HCT: 32.6 % — ABNORMAL LOW (ref 36.0–46.0)
Hemoglobin: 11 g/dL — ABNORMAL LOW (ref 12.0–15.0)
MCH: 28.7 pg (ref 26.0–34.0)
MCHC: 33.7 g/dL (ref 30.0–36.0)
MCV: 85.1 fL (ref 80.0–100.0)
Platelets: 291 10*3/uL (ref 150–400)
RBC: 3.83 MIL/uL — ABNORMAL LOW (ref 3.87–5.11)
RDW: 13.2 % (ref 11.5–15.5)
WBC: 25.7 10*3/uL — ABNORMAL HIGH (ref 4.0–10.5)
nRBC: 0 % (ref 0.0–0.2)

## 2023-04-10 MED ORDER — IBUPROFEN 600 MG PO TABS
600.0000 mg | ORAL_TABLET | Freq: Four times a day (QID) | ORAL | Status: DC | PRN
  Administered 2023-04-10 – 2023-04-11 (×4): 600 mg via ORAL
  Filled 2023-04-10 (×4): qty 1

## 2023-04-10 NOTE — Progress Notes (Signed)
 Post Partum Day 1 Subjective: Patient is doing well without complaints. She ambulated, voided and tolerated a regular diet. She denies HA, visual changes, RUQ/epigastric pain. Incisional pain well controlled  Objective: Blood pressure (!) 110/50, pulse 64, temperature (P) 97.6 F (36.4 C), temperature source (P) Oral, resp. rate (P) 18, height 5\' 2"  (1.575 m), weight 92.6 kg, last menstrual period 07/09/2022, SpO2 (P) 99%, unknown if currently breastfeeding.  Physical Exam:  General: alert, cooperative, and no distress Lochia: appropriate Uterine Fundus: firm Incision: Pressure dressing removed. Honey comb dressing clean, dry and intact DVT Evaluation: No evidence of DVT seen on physical exam.  Recent Labs    04/09/23 1328 04/10/23 0429  HGB 12.1 11.0*  HCT 34.5* 32.6*    Assessment/Plan: 32 yp P2 POD#1 s/p LTCS with CHTN and preeclampsia with severe features - Patient to complete magnesium sulfate for seizure prophylaxis this morning -BP currently stable and in normal range. Will continue lasix. Patient previously not on any antihypertensive prior to pregnancy - Patient with type 2 DM and CBG fasting CBG 161- patient had cranberry juice around 1-2 am- not true fasting reading. Patient was diet control prior to pregnancy. Continue close monitoring - Encourage ambulation today following discontinuation of magnesium - Continue routine postpartum care   LOS: 2 days   Lisa Antigua, MD 04/10/2023, 7:00 AM

## 2023-04-10 NOTE — Plan of Care (Signed)
 Problem: Education: Goal: Knowledge of General Education information will improve Description: Including pain rating scale, medication(s)/side effects and non-pharmacologic comfort measures Outcome: Progressing   Problem: Health Behavior/Discharge Planning: Goal: Ability to manage health-related needs will improve Outcome: Progressing   Problem: Clinical Measurements: Goal: Ability to maintain clinical measurements within normal limits will improve Outcome: Progressing Goal: Will remain free from infection Outcome: Progressing Goal: Diagnostic test results will improve Outcome: Progressing Goal: Respiratory complications will improve Outcome: Progressing Goal: Cardiovascular complication will be avoided Outcome: Progressing   Problem: Activity: Goal: Risk for activity intolerance will decrease Outcome: Progressing   Problem: Nutrition: Goal: Adequate nutrition will be maintained Outcome: Progressing   Problem: Coping: Goal: Level of anxiety will decrease Outcome: Progressing   Problem: Elimination: Goal: Will not experience complications related to bowel motility Outcome: Progressing Goal: Will not experience complications related to urinary retention Outcome: Progressing   Problem: Pain Managment: Goal: General experience of comfort will improve and/or be controlled Outcome: Progressing   Problem: Safety: Goal: Ability to remain free from injury will improve Outcome: Progressing   Problem: Skin Integrity: Goal: Risk for impaired skin integrity will decrease Outcome: Progressing   Problem: Education: Goal: Knowledge of Childbirth will improve Outcome: Progressing Goal: Ability to make informed decisions regarding treatment and plan of care will improve Outcome: Progressing Goal: Ability to state and carry out methods to decrease the pain will improve Outcome: Progressing Goal: Individualized Educational Video(s) Outcome: Progressing   Problem:  Coping: Goal: Ability to verbalize concerns and feelings about labor and delivery will improve Outcome: Progressing   Problem: Life Cycle: Goal: Ability to make normal progression through stages of labor will improve Outcome: Progressing Goal: Ability to effectively push during vaginal delivery will improve Outcome: Progressing   Problem: Role Relationship: Goal: Will demonstrate positive interactions with the child Outcome: Progressing   Problem: Safety: Goal: Risk of complications during labor and delivery will decrease Outcome: Progressing   Problem: Pain Management: Goal: Relief or control of pain from uterine contractions will improve Outcome: Progressing   Problem: Education: Goal: Knowledge of disease or condition will improve Outcome: Progressing Goal: Knowledge of the prescribed therapeutic regimen will improve Outcome: Progressing   Problem: Fluid Volume: Goal: Peripheral tissue perfusion will improve Outcome: Progressing   Problem: Clinical Measurements: Goal: Complications related to disease process, condition or treatment will be avoided or minimized Outcome: Progressing   Problem: Education: Goal: Ability to describe self-care measures that may prevent or decrease complications (Diabetes Survival Skills Education) will improve Outcome: Progressing Goal: Individualized Educational Video(s) Outcome: Progressing   Problem: Coping: Goal: Ability to adjust to condition or change in health will improve Outcome: Progressing   Problem: Fluid Volume: Goal: Ability to maintain a balanced intake and output will improve Outcome: Progressing   Problem: Health Behavior/Discharge Planning: Goal: Ability to identify and utilize available resources and services will improve Outcome: Progressing Goal: Ability to manage health-related needs will improve Outcome: Progressing   Problem: Metabolic: Goal: Ability to maintain appropriate glucose levels will  improve Outcome: Progressing   Problem: Nutritional: Goal: Maintenance of adequate nutrition will improve Outcome: Progressing Goal: Progress toward achieving an optimal weight will improve Outcome: Progressing   Problem: Skin Integrity: Goal: Risk for impaired skin integrity will decrease Outcome: Progressing   Problem: Tissue Perfusion: Goal: Adequacy of tissue perfusion will improve Outcome: Progressing   Problem: Education: Goal: Knowledge of the prescribed therapeutic regimen will improve Outcome: Progressing Goal: Understanding of sexual limitations or changes related to disease process or condition  will improve Outcome: Progressing Goal: Individualized Educational Video(s) Outcome: Progressing   Problem: Self-Concept: Goal: Communication of feelings regarding changes in body function or appearance will improve Outcome: Progressing   Problem: Skin Integrity: Goal: Demonstration of wound healing without infection will improve Outcome: Progressing

## 2023-04-10 NOTE — Anesthesia Postprocedure Evaluation (Signed)
 Anesthesia Post Note  Patient: Lisa Ritter  Procedure(s) Performed: CESAREAN DELIVERY     Patient location during evaluation: PACU Anesthesia Type: Epidural Level of consciousness: awake and alert Pain management: pain level controlled Vital Signs Assessment: post-procedure vital signs reviewed and stable Respiratory status: spontaneous breathing and respiratory function stable Cardiovascular status: blood pressure returned to baseline and stable Postop Assessment: epidural receding Anesthetic complications: no   No notable events documented.  Last Vitals:  Vitals:   04/09/23 2357 04/10/23 0409  BP:  (!) 110/50  Pulse:  64  Resp:  (P) 18  Temp: 36.4 C (P) 36.4 C  SpO2:  (P) 99%    Last Pain:  Vitals:   04/10/23 0409  TempSrc: (P) Oral  PainSc:                  Kennieth Rad

## 2023-04-10 NOTE — Clinical Social Work Maternal (Signed)
 CLINICAL SOCIAL WORK MATERNAL/CHILD NOTE  Patient Details  Name: Lisa Ritter MRN: [id number] Date of Birth: 11-16-1990  Date:  04/10/2023  Clinical Social Worker Initiating Note:  [place], [place] Date/Time: Initiated:  04/10/23/1447     Child's Name:  Lisa Ritter   Biological Parents:  Mother, Father (MOB: Betta Balla 08-28-89, FOB: Saralyn Pilar 05/12/1987)   Need for Interpreter:  None   Reason for Referral:  Other (Comment) (NICU admission)   Address:  [address] [place] [address]    Phone number:  [phone] (home)     Additional phone number:   Household Members/Support Persons (HM/SP):   Household Member/Support Person 2, Household Member/Support Person 3, Household Member/Support Person 4, Household Member/Support Person 1   HM/SP Name Relationship DOB or Age  HM/SP -1 Prince Olivier Spouse 05/12/1987  HM/SP -2 Sadira Standard Son 10/20/2020  HM/SP -3 Theola Sequin Mother 71  HM/SP -4 Royston Sinner Father 54  HM/SP -5        HM/SP -6        HM/SP -7        HM/SP -8          Natural Supports (not living in the home):  Friends, Extended Family   Professional Supports: None   Employment: Unemployed (FOB employed: [employer]   Type of Work:     Education:  Some Materials engineer arranged:    Surveyor, quantity Resources: declined SNAP benefits  Other Resources:      Cultural/Religious Considerations Which May Impact Care:    Strengths:  Ability to meet basic needs  , Home prepared for child  , Pediatrician chosen   Psychotropic Medications:         Pediatrician:    Armed forces operational officer area  Pediatrician List:   [employer]    [place] [facility]      Pediatrician Fax Number:    Risk Factors/Current Problems:  None   Cognitive State:  Able to Concentrate  , Goal Oriented  , Alert     Mood/Affect:  Comfortable  , Calm  , Interested     CSW  Assessment: CSW received consult for NICU admission. CSW met with MOB to offer support and complete assessment.    CSW met with MOB at bedside and introduced CSW role. CSW observed MOB resting in bed and FOB was present at bedside. MOB was receptive to the visit. She presented calm and engaged with CSW throughout the visit. CSW congratulated MOB and FOB on baby boy, [name]. CSW asked MOB how she had been doing. CSW inquired about MOB thoughts and feelings regarding infants NICU admission. MOB reported that the infant's NICU admission had been stressful for her. MOB mentioned that she had received good news regarding the infant's current condition, and she felt grateful for that. MOB reported the NICU care team has "kept her in the loop" by providing her with medical updates. CSW validated MOB emotions. CSW inquired about MOB supports during this time. MOB identified her spouse, parents and friends as her support network. CSW offered to check in with MOB weekly to offer support while the infant remains in NICU. MOB opted to reach out to CSW if needs arise.   CSW inquired if MOB had mental health history. MOB denied mental health history. CSW asked if MOB had PPD symptoms with her older child. MOB denied PPD concerns.  CSW provided education regarding the baby blues period vs. perinatal mood disorders, discussed treatment and gave resources for mental health follow up if concerns arise.  CSW recommended MOB complete a self-evaluation during the postpartum time period using the New Mom Checklist from Postpartum Progress and encouraged MOB to contact a medical professional if symptoms are noted at any time. CSW assessed MOB for safety. MOB denied SI/HI.   CSW asked MOB if she had essential items to care for the infant. MOB reported that she had all essential items to care for the infant. MOB reported that she had chosen a pediatrician at [facility]. CSW provided review of Sudden Infant Death Syndrome (SIDS)  precautions. MOB reported that she was familiar with SIDS and that she understood the precautions.   CSW identifies no further need for intervention and no barriers to discharge at this time.  CSW Plan/Description:  Perinatal Mood and Anxiety Disorder (PMADs) Education, Sudden Infant Death Syndrome (SIDS) Education, No Further Intervention Required/No Barriers to Discharge    Clearance Coots, LCSW 04/10/2023, 3:22 PM

## 2023-04-11 LAB — COMPREHENSIVE METABOLIC PANEL WITH GFR
ALT: 18 U/L (ref 0–44)
AST: 24 U/L (ref 15–41)
Albumin: 2.1 g/dL — ABNORMAL LOW (ref 3.5–5.0)
Alkaline Phosphatase: 137 U/L — ABNORMAL HIGH (ref 38–126)
Anion gap: 12 (ref 5–15)
BUN: 13 mg/dL (ref 6–20)
CO2: 23 mmol/L (ref 22–32)
Calcium: 9.2 mg/dL (ref 8.9–10.3)
Chloride: 101 mmol/L (ref 98–111)
Creatinine, Ser: 0.83 mg/dL (ref 0.44–1.00)
GFR, Estimated: >60 mL/min (ref >60)
Glucose, Bld: 83 mg/dL (ref 70–99)
Potassium: 4 mmol/L (ref 3.5–5.1)
Sodium: 136 mmol/L (ref 135–145)
Total Bilirubin: 0.4 mg/dL (ref 0.0–1.2)
Total Protein: 5.3 g/dL — ABNORMAL LOW (ref 6.5–8.1)

## 2023-04-11 LAB — SURGICAL PATHOLOGY

## 2023-04-11 NOTE — Lactation Note (Signed)
 This note was copied from a baby's chart.  NICU Lactation Consultation Note  Patient Name: Boy Leandrea Ackley [id number]'[id number] Date: 04/11/2023 Age:34 hours  Reason for consult: Follow-up assessment; NICU baby; Term; Maternal endocrine disorder; Other (Comment) (cHTN, Pre-E) Type of Endocrine Disorder?: Diabetes (T2DM (glyburide))  SUBJECTIVE Visited with family of 25 25/40 weeks old NICU female; "[name]" is still undergoing therapeutic hypothermia due to neonatal encephalopathy. She reported she's been pumping but only getting drops, praised her for her efforts. Explained that the purpose of pumping this early on is mainly for breast stimulation and not to get volume, although she got enough to do oral care. Her plan is to do both, direct breastfeeding along with pumping and bottle feeding. Reviewed pumping schedule, lactogenesis II, STS care and anticipatory guidelines.   OBJECTIVE Infant data: Mother's Current Feeding Choice: -- (NPO)  O2 Device: Room Air  Infant feeding assessment IDFTS - Readiness: 4   Maternal data: [id number] C-Section, Low Transverse Pumping frequency: 5 times/24 hours Pumped volume: 0 mL (drops)  WIC Program: No WIC Referral Sent?: No Pump: Personal (Medela DEBP at home)  ASSESSMENT Infant: Feeding Status: NPO  Maternal: Milk volume: Normal  INTERVENTIONS/PLAN Interventions: Interventions: Breast feeding basics reviewed; Coconut oil; DEBP; Education  Plan: STS care once able to Massage and hand express both breasts prior/after pumping, coconut oil prior pumping Pump both breast on initiate mode every 3 hours for 15 minutes; ideally 8 pumping sessions/24 hours  No other support person at this time. All questions and concerns answered, family to contact [facility] services PRN.  Consult Status: NICU follow-up NICU Follow-up type: Maternal D/C visit   Melessa Cowell Venetia Constable 04/11/2023, 1:35 PM

## 2023-04-11 NOTE — Progress Notes (Addendum)
 Postoperative day 2 s/p repeat cesarean for nonreassuring fetal status after induction for [facility] with preeclampsia with severe features and T2DM  Subjective: Doing well, ambulating, voiding, pain controlled with current regimen. No significant bleeding. Pumping. Denies chest pain or shortness of breath.  Objective: Vitals:   04/11/23 0637 04/11/23 0805  BP: (!) 144/79 139/67  Pulse: 96 96  Resp:  18  Temp: 98.4 F (36.9 C) 98.2 F (36.8 C)  SpO2: 100% 98%     Physical Exam:  General: alert and cooperative CV: RRR Lungs: CTAB Abd: soft/nontender/nondistended Lochia: appropriate Uterine Fundus: firm Incision: clean/dry/intact with honeycomb dressing in place DVT Evaluation: Negative Homan's sign.  Recent Labs    04/09/23 1328 04/10/23 0429  HGB 12.1 11.0*  HCT 34.5* 32.6*    Assessment/Plan: Postoperative: meeting milestones, continue routine care, ambulation encouraged  CHTN with preeclampsia without severe features: BP initially within normal limits postpartum, she now had a couple mild, will see how she does today and add antihypertensive as needed if they remain elevated -- continue lasix  3. T2DM: BS good control, appropriate fasting this morning, continue diet control  4. Obesity: continue lovenox ppx, SCDs at bedside  5. Dispo: continue inpatient management - Contraception: undecided, counseled on options - pumping at bedside - Rh status: RH pos  6. Neonatal: in NICU, plan for circumcision   LOS: 3 days   Wanita Chamberlain 04/11/2023, 8:46 AM

## 2023-04-12 ENCOUNTER — Other Ambulatory Visit (HOSPITAL_COMMUNITY): Payer: Self-pay

## 2023-04-12 MED ORDER — SIMETHICONE 80 MG PO CHEW
80.0000 mg | CHEWABLE_TABLET | Freq: Four times a day (QID) | ORAL | 0 refills | Status: AC | PRN
Start: 2023-04-12 — End: ?
  Filled 2023-04-12: qty 30, 8d supply, fill #0

## 2023-04-12 MED ORDER — NIFEDIPINE ER OSMOTIC RELEASE 30 MG PO TB24
30.0000 mg | ORAL_TABLET | Freq: Every day | ORAL | Status: DC
  Administered 2023-04-12: 30 mg via ORAL
  Filled 2023-04-12: qty 1

## 2023-04-12 MED ORDER — IBUPROFEN 600 MG PO TABS
600.0000 mg | ORAL_TABLET | Freq: Four times a day (QID) | ORAL | 0 refills | Status: AC | PRN
  Filled 2023-04-12: qty 30, 8d supply, fill #0

## 2023-04-12 MED ORDER — GABAPENTIN 100 MG PO CAPS
200.0000 mg | ORAL_CAPSULE | Freq: Every day | ORAL | 0 refills | Status: DC
  Filled 2023-04-12: qty 20, 10d supply, fill #0

## 2023-04-12 MED ORDER — FUROSEMIDE 20 MG PO TABS
20.0000 mg | ORAL_TABLET | Freq: Every day | ORAL | 0 refills | Status: DC
  Filled 2023-04-12: qty 2, 2d supply, fill #0

## 2023-04-12 MED ORDER — POTASSIUM CHLORIDE 20 MEQ PO PACK
20.0000 meq | PACK | Freq: Every day | ORAL | 0 refills | Status: AC
  Filled 2023-04-12: qty 2, 2d supply, fill #0

## 2023-04-12 MED ORDER — OXYCODONE-ACETAMINOPHEN 5-325 MG PO TABS
1.0000 | ORAL_TABLET | Freq: Four times a day (QID) | ORAL | 0 refills | Status: DC | PRN
  Filled 2023-04-12: qty 30, 4d supply, fill #0

## 2023-04-12 MED ORDER — NIFEDIPINE ER 30 MG PO TB24
30.0000 mg | ORAL_TABLET | Freq: Every day | ORAL | 0 refills | Status: DC
  Filled 2023-04-12: qty 30, 30d supply, fill #0

## 2023-04-12 NOTE — Lactation Note (Signed)
 This note was copied from a baby's chart.  NICU Lactation Consultation Note  Patient Name: Lisa Ritter [id number] Date: 04/12/2023 Age:34 hours  Reason for consult: Follow-up assessment; NICU baby; Term; Maternal endocrine disorder; Other (Comment) (cHTN, Pre-E) Type of Endocrine Disorder?: Diabetes (T2DM (glyburide))  SUBJECTIVE Visited with family of 56 75/19 weeks old NICU female "Lisa Ritter"; Lisa Ritter reported she's pumping consistently and her volumes have started to slowly and steadily increased, praised her for her efforts. She's been pumping one side at a time, discussed the advantages of bilateral pumping for a full supply. Lisa Ritter is getting discharged today. Reviewed discharge education, pump settings and the importance of consistent pumping for the onset of lactogenesis II and the prevention of engorgement.   OBJECTIVE Infant data: Mother's Current Feeding Choice: -- (NPO)  O2 Device: Room Air  Infant feeding assessment IDFTS - Readiness: 5 (cooling, takes paci)   Maternal data: [id number] C-Section, Low Transverse Pumping frequency: 5-6 times/24 hours Pumped volume: 15 mL  WIC Program: No WIC Referral Sent?: No Pump: Personal (Medela DEBP at home)  ASSESSMENT Infant: Feeding Status: NPO  Maternal: Milk volume: Normal  INTERVENTIONS/PLAN Interventions: Interventions: Breast feeding basics reviewed; Coconut oil; DEBP; Education Discharge Education: Engorgement and breast care  Plan: STS care once able to Take all pump pieces to baby's room after her discharge Pump both breast on initiate mode every 3 hours for 15 minutes; ideally 8 pumping sessions/24 hours Switch pump settings from initiate to maintain mode once expressing +20 ml of EBM combined or by day 5; whichever happens first  FOB present. All questions and concerns answered, family to contact [facility] services PRN.  Consult Status: NICU follow-up NICU Follow-up type: Verify onset of copious milk; Verify absence  of engorgement   Ronie Barnhart S Malic Rosten 04/12/2023, 1:33 PM

## 2023-04-12 NOTE — Progress Notes (Signed)
Discharge instructions and prescriptions given to pt. Discussed signs and symptoms to report to the MD, upcoming appointments, and meds. Pt verbalizes understanding and has no questions or concerns at this time. Pt discharged home from hospital in stable condition. 

## 2023-04-12 NOTE — Discharge Instructions (Signed)
 Continue to check your blood pressures twice a day Call the office for blood pressures that are consistently above 145 for the top number or 95 for the bottom number   Hypertension During Pregnancy Hypertension is also called high blood pressure. High blood pressure means that the force of your blood moving in your body is too strong. It can cause problems for you and your baby. Different types of high blood pressure can happen during pregnancy. The types are: High blood pressure before you got pregnant. This is called chronic hypertension.  This can continue during your pregnancy. Your doctor will want to keep checking your blood pressure. You may need medicine to keep your blood pressure under control while you are pregnant. You will need follow-up visits after you have your baby. High blood pressure that goes up during pregnancy when it was normal before. This is called gestational hypertension. It will usually get better after you have your baby, but your doctor will need to watch your blood pressure to make sure that it is getting better. Very high blood pressure during pregnancy. This is called preeclampsia. Very high blood pressure is an emergency that needs to be checked and treated right away. You may develop very high blood pressure after giving birth. This is called postpartum preeclampsia. This usually occurs within 48 hours after childbirth but may occur up to 6 weeks after giving birth. This is rare. How does this affect me? If you have high blood pressure during pregnancy, you have a higher chance of developing high blood pressure: As you get older. If you get pregnant again. In some cases, high blood pressure during pregnancy can cause: Stroke. Heart attack. Damage to the kidneys, lungs, or liver. Preeclampsia. Jerky movements you cannot control (convulsions or seizures). Problems with the placenta.  What can I do to lower my risk?  Keep a healthy weight. Eat a healthy  diet. Follow what your doctor tells you about treating any medical problems that you had before becoming pregnant. It is very important to go to all of your doctor visits. Your doctor will check your blood pressure and make sure that your pregnancy is progressing as it should. Treatment should start early if a problem is found.  Follow these instructions at home:  Take your blood pressure 1-2 times per day. Call the office if your blood pressure is 155 or higher for the top number or 105 or higher for the bottom number.    Eating and drinking  Drink enough fluid to keep your pee (urine) pale yellow. Avoid caffeine. Lifestyle Do not use any products that contain nicotine or tobacco, such as cigarettes, e-cigarettes, and chewing tobacco. If you need help quitting, ask your doctor. Do not use alcohol or drugs. Avoid stress. Rest and get plenty of sleep. Regular exercise can help. Ask your doctor what kinds of exercise are best for you. General instructions Take over-the-counter and prescription medicines only as told by your doctor. Keep all prenatal and follow-up visits as told by your doctor. This is important. Contact a doctor if: You have symptoms that your doctor told you to watch for, such as: Headaches. Nausea. Vomiting. Belly (abdominal) pain. Dizziness. Light-headedness. Get help right away if: You have: Very bad belly pain that does not get better with treatment. A very bad headache that does not get better. Vomiting that does not get better. Sudden, fast weight gain. Sudden swelling in your hands, ankles, or face. Blood in your pee. Blurry vision. Double vision.  Shortness of breath. Chest pain. Weakness on one side of your body. Trouble talking. Summary High blood pressure is also called hypertension. High blood pressure means that the force of your blood moving in your body is too strong. High blood pressure can cause problems for you and your baby. Keep all  follow-up visits as told by your doctor. This is important. This information is not intended to replace advice given to you by your health care provider. Make sure you discuss any questions you have with your health care provider. Document Released: 01/27/2010 Document Revised: 04/17/2018 Document Reviewed: 01/21/2018 Elsevier Patient Education  2020 [employer].

## 2023-04-14 ENCOUNTER — Telehealth: Payer: Self-pay | Admitting: Family Medicine

## 2023-04-14 DIAGNOSIS — O10919 Unspecified pre-existing hypertension complicating pregnancy, unspecified trimester: Secondary | ICD-10-CM

## 2023-04-14 MED ORDER — LABETALOL HCL 200 MG PO TABS: 200.0000 mg | ORAL_TABLET | Freq: Two times a day (BID) | ORAL | 3 refills | Status: AC

## 2023-04-14 NOTE — Telephone Encounter (Signed)
 Called to speak with patient and check in given infant still in NICU.  Baby appears to be progressing well and mom agrees. She was currently home and getting some rest in between pumping and hospital visits.  She did have some questions as seen below:   #Allergic Rxn to Nifedipine: hx of cHTN affecting pregnancy, pt states she took the Procardia and noticed shortly after new b/l lower extremity swelling with overlying erythema and subjective fever.  Pt states no hives but has stopped taking the medications given concerns for allergic reaction.  Pt states she doubled checked her [employer] lists and discovered she had similar episode to it prior.  She has not checked her BP as she states this morning is the first day she feels better and the side effects have weaned.  Denies hx of asthma, HA, CP, SOB, RUQ pain.  Pt states the swelling later that day resolved with elevation and cessation of medication.  Agreeable to BP diary as OP clinic visit tomorrow.  She also is agreeable with delayed prescribing of Labetalol 200mg  BID in case BP >140/>90.  She has strict usual return precautions for MAU as well, which she states understanding and agreeable to.  - Labetalol 200mg  BID sent  - D/C Procardia, added as allergy   #Incision drainage: pt states she had noticed a slight yellow-clear spotty drainage from approximately midline of the incision, denies induration / purulence / erythema / F/C / Abd tenderness.  Incision check tomorrow as scheduled.  Strict / usual MAU return precautions reviewed as well, pt understanding / agreeable.    Mittie Bodo, MD Family Medicine - Obstetrics Fellow

## 2023-04-15 ENCOUNTER — Ambulatory Visit: Admitting: *Deleted

## 2023-04-15 VITALS — BP 126/80 | HR 75 | Wt 195.0 lb

## 2023-04-15 DIAGNOSIS — Z0130 Encounter for examination of blood pressure without abnormal findings: Secondary | ICD-10-CM

## 2023-04-15 DIAGNOSIS — O34219 Maternal care for unspecified type scar from previous cesarean delivery: Secondary | ICD-10-CM

## 2023-04-15 DIAGNOSIS — O1410 Severe pre-eclampsia, unspecified trimester: Secondary | ICD-10-CM

## 2023-04-15 NOTE — Progress Notes (Signed)
 Subjective:  Lisa Ritter is a 33 y.o. female here for BP check and incision check.  Hypertension ROS: no TIA's, no chest pain on exertion, no dyspnea on exertion, and no swelling of ankles.   Pt reports incision healing well  Objective:  LMP 07/09/2022 (Exact Date)   Appearance alert, well appearing, and in no distress and oriented to person, place, and time. Incision healing well   Assessment:   Blood Pressure today in office well controlled.  Incision healed nicely, no signs of infection, scar looks good. Adhesive remover used and wound cleansed with warm soap and water.  Plan:  Current treatment plan is effective, no change in therapy.Marland Kitchen

## 2023-04-19 ENCOUNTER — Ambulatory Visit (HOSPITAL_COMMUNITY): Payer: Self-pay

## 2023-04-19 NOTE — Lactation Note (Signed)
 This note was copied from a baby's chart.  NICU Lactation Consultation Note  Patient Name: Lisa Ritter [id number] Date: 04/19/2023 Age:34 days  Reason for consult: Follow-up assessment; NICU baby; Maternal endocrine disorder; Other (Comment); Term (cHTN, Pre-E) Type of Endocrine Disorder?: Diabetes (T2DM (glyburide))  SUBJECTIVE Visited with family of 46 30/58 weeks old AGA NICU female; Lisa Ritter is a P2 and reports she's pumping consistently, her supply continues to slowly increase, praised her for her efforts. Reviewed strategies to increase supply such as power pumping and STS care & putting baby to breast. Parents have been working on bottle feeding during NICU stay but she's open to take baby "Lisa Ritter" to breast once they go home. Parents are taking baby Lisa Ritter home today. Reviewed discharge education and the importance of consistent pumping even after feedings/attempts at the breast to protect her supply. She politely declined a referral to [facility] OP but has their contact info in case they need to reach out. FOB present. All questions and concerns answered, family to contact [facility] services PRN.  OBJECTIVE Infant data: Mother's Current Feeding Choice: Breast Milk and Formula  O2 Device: Room Air  Infant feeding assessment IDFTS - Readiness: 1 IDFTS - Quality: 2   Maternal data: [id number] C-Section, Low Transverse Pumping frequency: 8 times/24 hours Pumped volume: 60 mL (60-70 ml)  WIC Program: No WIC Referral Sent?: No Pump: Personal (Medela DEBP at home)  ASSESSMENT Infant: Feeding Status: Ad lib Feeding method: Bottle Nipple Type: Nfant Slow Flow (purple)  Maternal: Milk volume: Normal  INTERVENTIONS/PLAN Interventions: Interventions: Breast feeding basics reviewed; Coconut oil; DEBP; Education Discharge Education: Outpatient recommendation  Plan: Consult Status: Complete   Lisa Ritter 04/19/2023, 4:51 PM

## 2023-06-10 ENCOUNTER — Ambulatory Visit (INDEPENDENT_AMBULATORY_CARE_PROVIDER_SITE_OTHER): Admitting: Obstetrics and Gynecology

## 2023-06-10 ENCOUNTER — Encounter: Payer: Self-pay | Admitting: Obstetrics and Gynecology

## 2023-06-10 NOTE — Progress Notes (Signed)
 Post Partum Visit Note  Lisa Ritter is a 33 y.o. [phone] female who presents for a postpartum visit. She is 8 weeks postpartum following a repeat cesarean section.  I have fully reviewed the prenatal and intrapartum course. The delivery was at 39.0 gestational weeks.  Anesthesia: epidural. Postpartum course has been complicated by superimposed severe preeclampsia. Baby is doing well. Baby is feeding by breast. Bleeding no bleeding. Bowel function is normal. Bladder function is normal. Patient is sexually active. Contraception method is none.   Postpartum depression screening: negative, score 0.   The pregnancy intention screening data noted above was reviewed. Potential methods of contraception were discussed. The patient elected to proceed with No data recorded.   Edinburgh Postnatal Depression Scale - 06/10/23 0948       Edinburgh Postnatal Depression Scale:  In the Past 7 Days   I have been able to laugh and see the funny side of things. 0    I have looked forward with enjoyment to things. 0    I have blamed myself unnecessarily when things went wrong. 0    I have been anxious or worried for no good reason. 0    I have felt scared or panicky for no good reason. 0    Things have been getting on top of me. 0    I have been so unhappy that I have had difficulty sleeping. 0    I have felt sad or miserable. 0    I have been so unhappy that I have been crying. 0    The thought of harming myself has occurred to me. 0    Edinburgh Postnatal Depression Scale Total 0             Health Maintenance Due  Topic Date Due   FOOT EXAM  Never done   OPHTHALMOLOGY EXAM  Never done   Diabetic kidney evaluation - Urine ACR  Never done   DTaP/Tdap/Td (1 - Tdap) Never done   COVID-19 Vaccine (1 - 2024-25 season) Never done   HEMOGLOBIN A1C  03/13/2023       Review of Systems Pertinent items noted in HPI and remainder of comprehensive ROS otherwise negative.  Objective:  BP (!)  146/90   Pulse 75   Ht 5\' 2"  (1.575 m)   Wt 199 lb (90.3 kg)   LMP 07/09/2022 (Exact Date)   Breastfeeding Yes   BMI 36.40 kg/m    General:  alert, cooperative, and no distress   Breasts:  normal  Lungs: clear to auscultation bilaterally  Heart:  regular rate and rhythm  Abdomen: soft, non-tender; bowel sounds normal; no masses,  no organomegaly   Wound well approximated incision  GU exam:  not indicated       Assessment:   Normal postpartum exam.   Plan:   Essential components of care per ACOG recommendations:  1.  Mood and well being: Patient with negative depression screening today. Reviewed local resources for support.  - Patient tobacco use? No.   - hx of drug use? No.    2. Infant care and feeding:  -Patient currently breastmilk feeding? Yes. Discussed returning to work and pumping. Reviewed importance of draining breast regularly to support lactation.  -Social determinants of health (SDOH) reviewed in EPIC. No concerns  3. Sexuality, contraception and birth spacing - Patient does want a pregnancy in the next year.  Desired family size is 3 children.  - Reviewed reproductive life planning. Reviewed contraceptive methods  based on pt preferences and effectiveness.  Patient desired Female Condom today.   - Discussed birth spacing of 18 months  4. Sleep and fatigue -Encouraged family/partner/community support of 4 hrs of uninterrupted sleep to help with mood and fatigue  5. Physical Recovery  - Discussed patients delivery and complications. She describes her labor as good. - Patient had a C-section, no problems at delivery.  - Patient has urinary incontinence? No. - Patient is safe to resume physical and sexual activity  6.  Health Maintenance - HM due items addressed Yes - Last pap smear  Diagnosis  Date Value Ref Range Status  09/27/2022   Final   - Negative for intraepithelial lesion or malignancy (NILM)   Pap smear not done at today's visit.  -Breast Cancer  screening indicated? No.   7. Chronic Disease/Pregnancy Condition follow up: Hypertension. Patient will return for postpartum glucola  - PCP follow up  Verlyn Goad, MD [facility], [facility]

## 2023-06-18 ENCOUNTER — Other Ambulatory Visit

## 2023-06-18 DIAGNOSIS — O24415 Gestational diabetes mellitus in pregnancy, controlled by oral hypoglycemic drugs: Secondary | ICD-10-CM

## 2023-06-19 LAB — GLUCOSE TOLERANCE, 2 HOURS
Glucose, 2 hour: 235 mg/dL — ABNORMAL HIGH (ref 70–139)
Glucose, GTT - Fasting: 97 mg/dL (ref 70–99)

## 2023-06-20 ENCOUNTER — Ambulatory Visit: Payer: Self-pay | Admitting: Obstetrics and Gynecology
